# Patient Record
Sex: Male | Born: 1975 | Race: White | Hispanic: No | Smoking: Current some day smoker
Health system: Southern US, Community
[De-identification: ages and names within clinical notes are randomized; demographics above are authoritative.]

## PROBLEM LIST (undated history)

## (undated) DIAGNOSIS — T7840XA Allergy, unspecified, initial encounter: Secondary | ICD-10-CM

## (undated) DIAGNOSIS — K219 Gastro-esophageal reflux disease without esophagitis: Secondary | ICD-10-CM

## (undated) HISTORY — DX: Allergy, unspecified, initial encounter: T78.40XA

## (undated) HISTORY — DX: Gastro-esophageal reflux disease without esophagitis: K21.9

## (undated) HISTORY — PX: WISDOM TOOTH EXTRACTION: SHX21

## (undated) HISTORY — PX: BACK SURGERY: SHX140

---

## 2015-08-28 DIAGNOSIS — L570 Actinic keratosis: Secondary | ICD-10-CM | POA: Insufficient documentation

## 2015-08-28 DIAGNOSIS — D171 Benign lipomatous neoplasm of skin and subcutaneous tissue of trunk: Secondary | ICD-10-CM | POA: Insufficient documentation

## 2019-08-19 ENCOUNTER — Encounter: Payer: Self-pay | Admitting: Physician Assistant

## 2019-08-19 ENCOUNTER — Other Ambulatory Visit: Payer: Self-pay

## 2019-08-19 ENCOUNTER — Ambulatory Visit (INDEPENDENT_AMBULATORY_CARE_PROVIDER_SITE_OTHER): Payer: Federal, State, Local not specified - PPO | Admitting: Physician Assistant

## 2019-08-19 VITALS — BP 112/76 | HR 59 | Temp 98.9°F | Resp 16 | Ht 69.0 in | Wt 158.0 lb

## 2019-08-19 DIAGNOSIS — Z8371 Family history of colonic polyps: Secondary | ICD-10-CM

## 2019-08-19 DIAGNOSIS — F411 Generalized anxiety disorder: Secondary | ICD-10-CM | POA: Diagnosis not present

## 2019-08-19 DIAGNOSIS — Z23 Encounter for immunization: Secondary | ICD-10-CM | POA: Diagnosis not present

## 2019-08-19 DIAGNOSIS — Z0000 Encounter for general adult medical examination without abnormal findings: Secondary | ICD-10-CM

## 2019-08-19 DIAGNOSIS — Z0001 Encounter for general adult medical examination with abnormal findings: Secondary | ICD-10-CM

## 2019-08-19 DIAGNOSIS — K649 Unspecified hemorrhoids: Secondary | ICD-10-CM | POA: Diagnosis not present

## 2019-08-19 DIAGNOSIS — L821 Other seborrheic keratosis: Secondary | ICD-10-CM | POA: Diagnosis not present

## 2019-08-19 DIAGNOSIS — L814 Other melanin hyperpigmentation: Secondary | ICD-10-CM

## 2019-08-19 LAB — COMPREHENSIVE METABOLIC PANEL
ALT: 15 U/L (ref 0–53)
AST: 15 U/L (ref 0–37)
Albumin: 4.7 g/dL (ref 3.5–5.2)
Alkaline Phosphatase: 35 U/L — ABNORMAL LOW (ref 39–117)
BUN: 20 mg/dL (ref 6–23)
CO2: 30 mEq/L (ref 19–32)
Calcium: 9.6 mg/dL (ref 8.4–10.5)
Chloride: 103 mEq/L (ref 96–112)
Creatinine, Ser: 0.98 mg/dL (ref 0.40–1.50)
GFR: 82.97 mL/min (ref >60.00)
Glucose, Bld: 93 mg/dL (ref 70–99)
Potassium: 4.2 mEq/L (ref 3.5–5.1)
Sodium: 139 mEq/L (ref 135–145)
Total Bilirubin: 0.6 mg/dL (ref 0.2–1.2)
Total Protein: 6.6 g/dL (ref 6.0–8.3)

## 2019-08-19 LAB — CBC WITH DIFFERENTIAL/PLATELET
Basophils Absolute: 0.1 10*3/uL (ref 0.0–0.1)
Basophils Relative: 1.2 % (ref 0.0–3.0)
Eosinophils Absolute: 0.1 10*3/uL (ref 0.0–0.7)
Eosinophils Relative: 2.1 % (ref 0.0–5.0)
HCT: 41.1 % (ref 39.0–52.0)
Hemoglobin: 14.3 g/dL (ref 13.0–17.0)
Lymphocytes Relative: 30.3 % (ref 12.0–46.0)
Lymphs Abs: 1.3 10*3/uL (ref 0.7–4.0)
MCHC: 34.9 g/dL (ref 30.0–36.0)
MCV: 91.4 fl (ref 78.0–100.0)
Monocytes Absolute: 0.4 10*3/uL (ref 0.1–1.0)
Monocytes Relative: 10.5 % (ref 3.0–12.0)
Neutro Abs: 2.4 10*3/uL (ref 1.4–7.7)
Neutrophils Relative %: 55.9 % (ref 43.0–77.0)
Platelets: 130 10*3/uL — ABNORMAL LOW (ref 150.0–400.0)
RBC: 4.5 Mil/uL (ref 4.22–5.81)
RDW: 12.7 % (ref 11.5–15.5)
WBC: 4.2 10*3/uL (ref 4.0–10.5)

## 2019-08-19 LAB — LIPID PANEL
Cholesterol: 159 mg/dL (ref 0–200)
HDL: 47 mg/dL (ref >39.00)
LDL Cholesterol: 101 mg/dL — ABNORMAL HIGH (ref 0–99)
NonHDL: 111.77
Total CHOL/HDL Ratio: 3
Triglycerides: 52 mg/dL (ref 0.0–149.0)
VLDL: 10.4 mg/dL (ref 0.0–40.0)

## 2019-08-19 LAB — TSH: TSH: 1.37 u[IU]/mL (ref 0.35–4.50)

## 2019-08-19 LAB — HEMOGLOBIN A1C: Hgb A1c MFr Bld: 5.1 % (ref 4.6–6.5)

## 2019-08-19 NOTE — Patient Instructions (Signed)
Please go to the lab for blood work.   Our office will call you with your results unless you have chosen to receive results via MyChart.  If your blood work is normal we will follow-up each year for physicals and as scheduled for chronic medical problems.  If anything is abnormal we will treat accordingly and get you in for a follow-up.  Please consider letting us start a medication for anxiety while you are waiting to see the specialist.  -- Look into the following Paroxetine (Paxil), Fluoxetine (Prozac) or Escitalopram (Lexapro), Hydroxyzine (Atarax), Lorazepam (Ativan), BuSpirone (BuSpar)  You will be contacted for assessment for a screening colonoscopy giving history.    Preventive Care 33-73 Years Old, Male Preventive care refers to lifestyle choices and visits with your health care provider that can promote health and wellness. This includes:  A yearly physical exam. This is also called an annual well check.  Regular dental and eye exams.  Immunizations.  Screening for certain conditions.  Healthy lifestyle choices, such as eating a healthy diet, getting regular exercise, not using drugs or products that contain nicotine and tobacco, and limiting alcohol use. What can I expect for my preventive care visit? Physical exam Your health care provider will check:  Height and weight. These may be used to calculate body mass index (BMI), which is a measurement that tells if you are at a healthy weight.  Heart rate and blood pressure.  Your skin for abnormal spots. Counseling Your health care provider may ask you questions about:  Alcohol, tobacco, and drug use.  Emotional well-being.  Home and relationship well-being.  Sexual activity.  Eating habits.  Work and work Statistician. What immunizations do I need?  Influenza (flu) vaccine  This is recommended every year. Tetanus, diphtheria, and pertussis (Tdap) vaccine  You may need a Td booster every 10 years.  Varicella (chickenpox) vaccine  You may need this vaccine if you have not already been vaccinated. Zoster (shingles) vaccine  You may need this after age 86. Measles, mumps, and rubella (MMR) vaccine  You may need at least one dose of MMR if you were born in 1957 or later. You may also need a second dose. Pneumococcal conjugate (PCV13) vaccine  You may need this if you have certain conditions and were not previously vaccinated. Pneumococcal polysaccharide (PPSV23) vaccine  You may need one or two doses if you smoke cigarettes or if you have certain conditions. Meningococcal conjugate (MenACWY) vaccine  You may need this if you have certain conditions. Hepatitis A vaccine  You may need this if you have certain conditions or if you travel or work in places where you may be exposed to hepatitis A. Hepatitis B vaccine  You may need this if you have certain conditions or if you travel or work in places where you may be exposed to hepatitis B. Haemophilus influenzae type b (Hib) vaccine  You may need this if you have certain risk factors. Human papillomavirus (HPV) vaccine  If recommended by your health care provider, you may need three doses over 6 months. You may receive vaccines as individual doses or as more than one vaccine together in one shot (combination vaccines). Talk with your health care provider about the risks and benefits of combination vaccines. What tests do I need? Blood tests  Lipid and cholesterol levels. These may be checked every 5 years, or more frequently if you are over 71 years old.  Hepatitis C test.  Hepatitis B test. Screening  Lung cancer screening. You may have this screening every year starting at age 32 if you have a 30-pack-year history of smoking and currently smoke or have quit within the past 15 years.  Prostate cancer screening. Recommendations will vary depending on your family history and other risks.  Colorectal cancer screening. All  adults should have this screening starting at age 74 and continuing until age 55. Your health care provider may recommend screening at age 61 if you are at increased risk. You will have tests every 1-10 years, depending on your results and the type of screening test.  Diabetes screening. This is done by checking your blood sugar (glucose) after you have not eaten for a while (fasting). You may have this done every 1-3 years.  Sexually transmitted disease (STD) testing. Follow these instructions at home: Eating and drinking  Eat a diet that includes fresh fruits and vegetables, whole grains, lean protein, and low-fat dairy products.  Take vitamin and mineral supplements as recommended by your health care provider.  Do not drink alcohol if your health care provider tells you not to drink.  If you drink alcohol: ? Limit how much you have to 0-2 drinks a day. ? Be aware of how much alcohol is in your drink. In the [place] one drink equals one 12 oz bottle of beer (355 mL), one 5 oz glass of wine (148 mL), or one 1 oz glass of hard liquor (44 mL). Lifestyle  Take daily care of your teeth and gums.  Stay active. Exercise for at least 30 minutes on 5 or more days each week.  Do not use any products that contain nicotine or tobacco, such as cigarettes, e-cigarettes, and chewing tobacco. If you need help quitting, ask your health care provider.  If you are sexually active, practice safe sex. Use a condom or other form of protection to prevent STIs (sexually transmitted infections).  Talk with your health care provider about taking a low-dose aspirin every day starting at age 46. What's next?  Go to your health care provider once a year for a well check visit.  Ask your health care provider how often you should have your eyes and teeth checked.  Stay up to date on all vaccines. This information is not intended to replace advice given to you by your health care provider. Make sure you discuss  any questions you have with your health care provider. Document Released: 12/08/2015 Document Revised: 11/05/2018 Document Reviewed: 11/05/2018 Elsevier Patient Education  2020 [employer]. .

## 2019-08-19 NOTE — Progress Notes (Signed)
Patient presents to clinic today to establish care. Would like a physical today. Is fasting for labs.   Diet -- Endorses well-balanced diet overall. Eats a lot of vegetables and lean protein. Some fruits. Avoids fatty or fried foods. Limits carbohydrates.   Exercise -- Is doing calisthenics daily at home. For cardio is mainly running after the kids, bicycling and doing yard work.   Acute Concerns: Patient endorses significant family history of colon polyps -- mother, grandparents, father and siblings. Has never had screening. Denies bowel changes, melena, hematochezia or tenesmus. Has history of hemorrhoids and was recently seen and told he had 3. Painful with prolonged sitting. No current bleeding. Is hydrating well and keeping good fiber intake.   Patient with history of solar keratoses. Has a few areas on his shoulder that he would like assessed. Stays in the sun a lot. Sometimes wears sunscreen but not always.   Patient requesting referral to Psychiatry for some ongoing anxiety and difficulty with stressors. Denies depressed mood or anhedonia. Denies SI/HI. Has been to counseling before and does not feel it is beneficial.  Health Maintenance: Immunizations -- Unsure of Tetanus. Will obtain records. Agrees to flu shot today.  Past Medical History:  Diagnosis Date  . Allergy   . GERD (gastroesophageal reflux disease)     Past Surgical History:  Procedure Laterality Date  . BACK SURGERY      No current outpatient medications on file prior to visit.   No current facility-administered medications on file prior to visit.     No Known Allergies  Family History  Problem Relation Age of Onset  . Diabetes Mother   . Colonic polyp Mother   . Asthma Mother   . Heart murmur Mother   . Arrhythmia Mother   . Heart attack Father   . Colonic polyp Father   . Arthritis Father   . Hypertension Father   . Hyperlipidemia Father   . Diabetes Maternal Grandmother   . Heart attack  Maternal Grandfather   . Cancer Paternal Grandmother   . Early death Paternal Grandfather     Social History   Socioeconomic History  . Marital status: Married    Spouse name: Not on file  . Number of children: Not on file  . Years of education: Not on file  . Highest education level: Not on file  Occupational History  . Not on file  Social Needs  . Financial resource strain: Not on file  . Food insecurity    Worry: Not on file    Inability: Not on file  . Transportation needs    Medical: Not on file    Non-medical: Not on file  Tobacco Use  . Smoking status: Never Smoker  . Smokeless tobacco: Never Used  Substance and Sexual Activity  . Alcohol use: Yes  . Drug use: Not Currently  . Sexual activity: Yes  Lifestyle  . Physical activity    Days per week: Not on file    Minutes per session: Not on file  . Stress: Not on file  Relationships  . Social Herbalist on phone: Not on file    Gets together: Not on file    Attends religious service: Not on file    Active member of club or organization: Not on file    Attends meetings of clubs or organizations: Not on file    Relationship status: Not on file  . Intimate partner violence    Fear of  current or ex partner: Not on file    Emotionally abused: Not on file    Physically abused: Not on file    Forced sexual activity: Not on file  Other Topics Concern  . Not on file  Social History Narrative  . Not on file   Review of Systems  Constitutional: Negative for fever and weight loss.  HENT: Negative for ear discharge, ear pain, hearing loss and tinnitus.   Eyes: Negative for blurred vision, double vision, photophobia and pain.  Respiratory: Negative for cough and shortness of breath.   Cardiovascular: Negative for chest pain and palpitations.  Gastrointestinal: Negative for abdominal pain, blood in stool, constipation, diarrhea, heartburn, melena, nausea and vomiting.  Genitourinary: Negative for dysuria,  flank pain, frequency, hematuria and urgency.  Musculoskeletal: Negative for falls.  Neurological: Negative for dizziness, loss of consciousness and headaches.  Endo/Heme/Allergies: Negative for environmental allergies.  Psychiatric/Behavioral: Negative for depression, hallucinations, substance abuse and suicidal ideas. The patient is not nervous/anxious and does not have insomnia.    Ht 5\' 9"  (1.753 m)   Wt 158 lb (71.7 kg)   BMI 23.33 kg/m   Physical Exam Vitals signs reviewed.  Constitutional:      General: He is not in acute distress.    Appearance: He is well-developed. He is not diaphoretic.  HENT:     Head: Normocephalic and atraumatic.     Right Ear: Tympanic membrane, ear canal and external ear normal.     Left Ear: Tympanic membrane, ear canal and external ear normal.     Nose: Nose normal.     Mouth/Throat:     Pharynx: No posterior oropharyngeal erythema.  Eyes:     Conjunctiva/sclera: Conjunctivae normal.     Pupils: Pupils are equal, round, and reactive to light.  Neck:     Musculoskeletal: Neck supple.     Thyroid: No thyromegaly.  Cardiovascular:     Rate and Rhythm: Normal rate and regular rhythm.     Heart sounds: Normal heart sounds.  Pulmonary:     Effort: Pulmonary effort is normal. No respiratory distress.     Breath sounds: Normal breath sounds. No wheezing or rales.  Chest:     Chest wall: No tenderness.  Abdominal:     General: Bowel sounds are normal. There is no distension.     Palpations: Abdomen is soft. There is no mass.     Tenderness: There is no abdominal tenderness. There is no guarding or rebound.  Lymphadenopathy:     Cervical: No cervical adenopathy.  Skin:    General: Skin is warm and dry.     Findings: No rash.  Neurological:     Mental Status: He is alert and oriented to person, place, and time.     Cranial Nerves: No cranial nerve deficit.    Assessment/Plan: 1. Visit for preventive health examination Depression screen  negative. Health Maintenance reviewed. Preventive schedule discussed and handout given in AVS. Will obtain fasting labs today.  - CBC with Differential/Platelet - Comprehensive metabolic panel - Hemoglobin A1c - Lipid panel - TSH  2. Family history of colonic polyps 3. Hemorrhoids, unspecified hemorrhoid type Referral to GI placed for screening colonoscopy.  - Ambulatory referral to Gastroenterology  4. Seborrheic keratosis Noted of left anterior scalp. Benign etiology reviewed with patient.   5. Solar lentigo Shoulder bilaterally. Skin care reviewed with patient. Skin protection reviewed. Recommend annual comprehensive skin examinations.  6. Generalized anxiety disorder Request referral to Psychiatry. Referral placed.  Discussed counseling but patient declined.  - Ambulatory referral to Psychiatry  7. Need for immunization against influenza - Flu Vaccine QUAD 36+ mos IM   Leeanne Rio, PA-C

## 2019-08-26 ENCOUNTER — Encounter: Payer: Self-pay | Admitting: Physician Assistant

## 2019-09-01 ENCOUNTER — Encounter: Payer: Self-pay | Admitting: Physician Assistant

## 2019-09-01 DIAGNOSIS — F329 Major depressive disorder, single episode, unspecified: Secondary | ICD-10-CM

## 2019-09-01 DIAGNOSIS — F419 Anxiety disorder, unspecified: Secondary | ICD-10-CM

## 2019-09-03 ENCOUNTER — Encounter: Payer: Self-pay | Admitting: Physician Assistant

## 2019-09-03 ENCOUNTER — Ambulatory Visit: Payer: Federal, State, Local not specified - PPO | Admitting: Physician Assistant

## 2019-09-03 ENCOUNTER — Other Ambulatory Visit: Payer: Self-pay

## 2019-09-03 VITALS — BP 118/66 | HR 70 | Temp 98.7°F | Ht 69.0 in | Wt 159.0 lb

## 2019-09-03 DIAGNOSIS — K649 Unspecified hemorrhoids: Secondary | ICD-10-CM | POA: Diagnosis not present

## 2019-09-03 DIAGNOSIS — Z8371 Family history of colonic polyps: Secondary | ICD-10-CM

## 2019-09-03 NOTE — Progress Notes (Signed)
Chief Complaint: Family history of colon polyps, hemorrhoids  HPI:    Scott Gregory is a 43 year old Caucasian male with a past medical history as listed below, who was referred to me by Scott Jeans, Scott Gregory for a complaint of family history of colon polyps and hemorrhoids.      08/19/2019 patient saw PCP and described a significant family history of colon polyps in his mother, grandparents, father and siblings.  At that time described a history of hemorrhoids and had recently been seen and told he had 3 which were painful with prolonged sitting.    08/19/2019 CMP, CBC and TSH normal.    Today, the patient presents to clinic and explains that he has a family history of polyps in his maternal grandmother, mom, sister and dad.  Tells me he is not sure exactly when they started having polyps removed, he knows his sister was at the age of 32.  He is also unsure of how many polyps but does not think it was more than 10.  Tells me he will have to get the details.  Denies family history of colon cancer.    Also tells me today that he has a history of hemorrhoids over the past 3 to 5 years which seem to come and go.  When they come on there is a lot of discomfort and he "feels something back there", but he has never had any rectal bleeding.  They typically go away within 3 to 5 days.  He only gets them once every couple of years, the last time was a few months ago and they were "multiples".  It was so bad that he "did not want to get out of bed".  It is now better.  Also describes that very occasionally he will have some mucus with a bowel movement or without but he has not seen any in the past couple of years.    Social history is positive for being the father of 3 boys, tells me they have moved every couple of years and this is stressful and adds to his anxiety.    Denies fever, chills, weight loss, change in bowel habits, abdominal pain, heartburn or reflux.  Past Medical History:  Diagnosis Date  .  Allergy   . GERD (gastroesophageal reflux disease)    Dietary and TLC related -- in 20s. No issues since stopping tobacco use    Past Surgical History:  Procedure Laterality Date  . BACK SURGERY      No current outpatient medications on file.   No current facility-administered medications for this visit.     Allergies as of 09/03/2019  . (No Known Allergies)    Family History  Problem Relation Age of Onset  . Diabetes Mother   . Colonic polyp Mother   . Asthma Mother   . Heart murmur Mother   . Arrhythmia Mother   . Heart attack Father   . Colonic polyp Father   . Arthritis Father   . Hypertension Father   . Hyperlipidemia Father   . Hearing loss Father   . Diabetes Maternal Grandmother   . Heart attack Maternal Grandfather   . Cancer Paternal Grandmother   . Early death Paternal Grandfather   . Colon polyps Sister     Social History   Socioeconomic History  . Marital status: Married    Spouse name: Not on file  . Number of children: Not on file  . Years of education: Not on file  .  Highest education level: Not on file  Occupational History  . Not on file  Social Needs  . Financial resource strain: Not on file  . Food insecurity    Worry: Not on file    Inability: Not on file  . Transportation needs    Medical: Not on file    Non-medical: Not on file  Tobacco Use  . Smoking status: Never Smoker  . Smokeless tobacco: Never Used  Substance and Sexual Activity  . Alcohol use: Yes  . Drug use: Not Currently  . Sexual activity: Yes  Lifestyle  . Physical activity    Days per week: Not on file    Minutes per session: Not on file  . Stress: Not on file  Relationships  . Social Herbalist on phone: Not on file    Gets together: Not on file    Attends religious service: Not on file    Active member of club or organization: Not on file    Attends meetings of clubs or organizations: Not on file    Relationship status: Not on file  . Intimate  partner violence    Fear of current or ex partner: Not on file    Emotionally abused: Not on file    Physically abused: Not on file    Forced sexual activity: Not on file  Other Topics Concern  . Not on file  Social History Narrative  . Not on file    Review of Systems:    Constitutional: No weight loss, fever or chills Skin: No rash  Cardiovascular: No chest pain Respiratory: No SOB  Gastrointestinal: See HPI and otherwise negative Genitourinary: No dysuria  Neurological: No headache, dizziness or syncope Musculoskeletal: No new muscle or joint pain Hematologic: No bleeding  Psychiatric: +anxiety   Physical Exam:  Vital signs: BP 118/66   Pulse 70   Temp 98.7 F (37.1 C) (Oral)   Ht 5\' 9"  (1.753 m)   Wt 159 lb (72.1 kg)   BMI 23.48 kg/m   Constitutional:   Pleasant Caucasian male appears to be in NAD, Well developed, Well nourished, alert and cooperative Head:  Normocephalic and atraumatic. Eyes:   PEERL, EOMI. No icterus. Conjunctiva pink. Ears:  Normal auditory acuity. Neck:  Supple Throat: Oral cavity and pharynx without inflammation, swelling or lesion.  Respiratory: Respirations even and unlabored. Lungs clear to auscultation bilaterally.   No wheezes, crackles, or rhonchi.  Cardiovascular: Normal S1, S2. No MRG. Regular rate and rhythm. No peripheral edema, cyanosis or pallor.  Gastrointestinal:  Soft, nondistended, nontender. No rebound or guarding. Normal bowel sounds. No appreciable masses or hepatomegaly. Rectal:  Not performed.  Msk:  Symmetrical without gross deformities. Without edema, no deformity or joint abnormality.  Neurologic:  Alert and  oriented x4;  grossly normal neurologically.  Skin:   Dry and intact without significant lesions or rashes. Psychiatric: Demonstrates good judgement and reason without abnormal affect or behaviors.  RELEVANT LABS AND IMAGING: CBC    Component Value Date/Time   WBC 4.2 08/19/2019 1141   RBC 4.50 08/19/2019 1141    HGB 14.3 08/19/2019 1141   HCT 41.1 08/19/2019 1141   PLT 130.0 (L) 08/19/2019 1141   MCV 91.4 08/19/2019 1141   MCHC 34.9 08/19/2019 1141   RDW 12.7 08/19/2019 1141   LYMPHSABS 1.3 08/19/2019 1141   MONOABS 0.4 08/19/2019 1141   EOSABS 0.1 08/19/2019 1141   BASOSABS 0.1 08/19/2019 1141    CMP  Component Value Date/Time   NA 139 08/19/2019 1141   K 4.2 08/19/2019 1141   CL 103 08/19/2019 1141   CO2 30 08/19/2019 1141   GLUCOSE 93 08/19/2019 1141   BUN 20 08/19/2019 1141   CREATININE 0.98 08/19/2019 1141   CALCIUM 9.6 08/19/2019 1141   PROT 6.6 08/19/2019 1141   ALBUMIN 4.7 08/19/2019 1141   AST 15 08/19/2019 1141   ALT 15 08/19/2019 1141   ALKPHOS 35 (L) 08/19/2019 1141   BILITOT 0.6 08/19/2019 1141    Assessment: 1.  Family history of colon polyps: Maternal grandmother, mother, father and sister, unsure of age when diagnosed or what type of polyps or how many 2.  Hemorrhoids: Off-and-on over the past 3 to 5 years, sound like external hemorrhoids with pain  Plan: 1.  Discussed with the patient that he would need to find out more details about his family history of colon polyps.  To be covered for a screening colonoscopy.  He needs to find out how many polyps and at what age they were diagnosed and what type of polyp they were if possible.  He tells me he will find out this information. 2.  Discussed hemorrhoids briefly, at this moment there are none per the patient, explained that if they are internal hemorrhoids we do a banding procedure.  Briefly discussed.  If they become more chronic for him we can discuss further.  Would recommend he have a colonoscopy before banding procedure if he needs one in the future. 3.  Patient is going to call us back with further details about polyps.  He is not sure if he wants to go through with a colonoscopy if it is for diagnostic purposes, due to cost.  He will also call his insurance and see how expensive this would be for him.  If he  would like a colonoscopy in the future within the next year this can be a direct colonoscopy with a nurse previsit. 4.  Patient was assigned to Dr. Rush Landmark today.  Ellouise Newer, Scott Gregory [facility] 09/03/2019, 8:25 AM  Cc: Scott Jeans, Scott Gregory

## 2019-09-03 NOTE — Patient Instructions (Signed)
We will put in a recall for a colonoscopy in 6 years.  Please follow up as needed

## 2019-09-04 NOTE — Progress Notes (Signed)
Attending Physician's Attestation   I have reviewed the chart.   I agree with the Advanced Practitioner's note, impression, and recommendations with any updates as below.  Agree a colonoscopy is recommended from a diagnostic perspective.  From a family history of colon polyps the history of multiple first-degree relatives and a second-degree relatives should be enough in regards to considering an early colonoscopy but he does have to check with his insurance.  If he elects not to have it done, some insurances allow [place] to initiate colon cancer screening at the [place] age of 47 and that he could have that done at the time of his next birthday.  However, we do not want rectal bleeding to just be thought of as hemorrhoidal and have a missing lesion.  Hopefully he will decide on a colonoscopy if able to financially.  Justice Britain, MD [facility] Advanced Endoscopy Office # [id number]

## 2019-09-06 MED ORDER — LORAZEPAM 0.5 MG PO TABS: 0.5000 mg | ORAL_TABLET | Freq: Two times a day (BID) | ORAL | 1 refills | Status: DC | PRN

## 2019-09-06 MED ORDER — SERTRALINE HCL 25 MG PO TABS: 25.0000 mg | ORAL_TABLET | Freq: Every day | ORAL | 1 refills | Status: DC

## 2019-09-06 NOTE — Telephone Encounter (Signed)
Please look at Psychiatry referral. Can we see if there is a location where MD can see him? He does not want to see NP. Remember this is for Psychiatry, not Psychology.

## 2019-09-06 NOTE — Addendum Note (Signed)
Addended by: Brunetta Jeans on: 09/06/2019 11:44 AM   Modules accepted: Orders

## 2019-09-28 ENCOUNTER — Ambulatory Visit (HOSPITAL_COMMUNITY): Payer: Federal, State, Local not specified - PPO | Admitting: Psychiatry

## 2019-10-15 ENCOUNTER — Other Ambulatory Visit: Payer: Self-pay

## 2019-10-15 ENCOUNTER — Encounter (HOSPITAL_COMMUNITY): Payer: Self-pay | Admitting: Psychiatry

## 2019-10-15 ENCOUNTER — Ambulatory Visit (INDEPENDENT_AMBULATORY_CARE_PROVIDER_SITE_OTHER): Payer: Federal, State, Local not specified - PPO | Admitting: Psychiatry

## 2019-10-15 DIAGNOSIS — F419 Anxiety disorder, unspecified: Secondary | ICD-10-CM
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
## 2020-01-11 DIAGNOSIS — K080 Exfoliation of teeth due to systemic causes: Secondary | ICD-10-CM | POA: Diagnosis not present

## 2020-06-09 ENCOUNTER — Encounter: Payer: Self-pay | Admitting: Physician Assistant

## 2020-06-09 DIAGNOSIS — F419 Anxiety disorder, unspecified: Secondary | ICD-10-CM

## 2020-06-09 MED ORDER — LORAZEPAM 0.5 MG PO TABS: 0.5000 mg | ORAL_TABLET | Freq: Two times a day (BID) | ORAL | 1 refills | Status: DC | PRN

## 2020-09-11 ENCOUNTER — Other Ambulatory Visit: Payer: Self-pay

## 2020-09-11 ENCOUNTER — Encounter: Payer: Self-pay | Admitting: Physician Assistant

## 2020-09-11 ENCOUNTER — Ambulatory Visit (INDEPENDENT_AMBULATORY_CARE_PROVIDER_SITE_OTHER): Payer: Federal, State, Local not specified - PPO | Admitting: Physician Assistant

## 2020-09-11 VITALS — BP 132/80 | HR 58 | Temp 98.4°F | Resp 16 | Ht 69.0 in | Wt 169.0 lb

## 2020-09-11 DIAGNOSIS — R079 Chest pain, unspecified: Secondary | ICD-10-CM | POA: Diagnosis not present

## 2020-09-11 DIAGNOSIS — Z1211 Encounter for screening for malignant neoplasm of colon: Secondary | ICD-10-CM | POA: Diagnosis not present

## 2020-09-11 DIAGNOSIS — G8929 Other chronic pain: Secondary | ICD-10-CM

## 2020-09-11 DIAGNOSIS — Z0001 Encounter for general adult medical examination with abnormal findings: Secondary | ICD-10-CM

## 2020-09-11 DIAGNOSIS — Z0000 Encounter for general adult medical examination without abnormal findings: Secondary | ICD-10-CM | POA: Diagnosis not present

## 2020-09-11 DIAGNOSIS — L819 Disorder of pigmentation, unspecified: Secondary | ICD-10-CM | POA: Diagnosis not present

## 2020-09-11 DIAGNOSIS — Z1159 Encounter for screening for other viral diseases: Secondary | ICD-10-CM | POA: Diagnosis not present

## 2020-09-11 LAB — LIPID PANEL
Cholesterol: 182 mg/dL (ref 0–200)
HDL: 57.6 mg/dL (ref >39.00)
LDL Cholesterol: 114 mg/dL — ABNORMAL HIGH (ref 0–99)
NonHDL: 124.27
Total CHOL/HDL Ratio: 3
Triglycerides: 52 mg/dL (ref 0.0–149.0)
VLDL: 10.4 mg/dL (ref 0.0–40.0)

## 2020-09-11 LAB — COMPREHENSIVE METABOLIC PANEL
ALT: 19 U/L (ref 0–53)
AST: 17 U/L (ref 0–37)
Albumin: 4.5 g/dL (ref 3.5–5.2)
Alkaline Phosphatase: 41 U/L (ref 39–117)
BUN: 17 mg/dL (ref 6–23)
CO2: 31 mEq/L (ref 19–32)
Calcium: 9.4 mg/dL (ref 8.4–10.5)
Chloride: 101 mEq/L (ref 96–112)
Creatinine, Ser: 0.92 mg/dL (ref 0.40–1.50)
GFR: 99.84 mL/min (ref >60.00)
Glucose, Bld: 84 mg/dL (ref 70–99)
Potassium: 4.3 mEq/L (ref 3.5–5.1)
Sodium: 138 mEq/L (ref 135–145)
Total Bilirubin: 0.7 mg/dL (ref 0.2–1.2)
Total Protein: 6.7 g/dL (ref 6.0–8.3)

## 2020-09-11 LAB — HEMOGLOBIN A1C: Hgb A1c MFr Bld: 5.3 % (ref 4.6–6.5)

## 2020-09-11 LAB — PSA: PSA: 0.3 ng/mL (ref 0.10–4.00)

## 2020-09-11 NOTE — Patient Instructions (Addendum)
Please go to the lab for blood work.   Our office will call you with your results unless you have chosen to receive results via MyChart.  If your blood work is normal we will follow-up each year for physicals and as scheduled for chronic medical problems.  If anything is abnormal we will treat accordingly and get you in for a follow-up.  You will be contacted by Cardiology for further evaluation giving your history and current symptoms. I do feel that this is musculoskeletal in nature but warrants further assessment.   Avoid heavy lifting and overexertion until we get things checked out. I am sending you for a chest x-ray as well. This will be done anytime M-F 8-5 at our [place] office -- [address] [place], [place]. No appointment needed.   You will also be contacted to discuss screening colonoscopy and Dermatology for a comprehensive skin examination.    Preventive Care 26-70 Years Old, Male Preventive care refers to lifestyle choices and visits with your health care provider that can promote health and wellness. This includes:  A yearly physical exam. This is also called an annual well check.  Regular dental and eye exams.  Immunizations.  Screening for certain conditions.  Healthy lifestyle choices, such as eating a healthy diet, getting regular exercise, not using drugs or products that contain nicotine and tobacco, and limiting alcohol use. What can I expect for my preventive care visit? Physical exam Your health care provider will check:  Height and weight. These may be used to calculate body mass index (BMI), which is a measurement that tells if you are at a healthy weight.  Heart rate and blood pressure.  Your skin for abnormal spots. Counseling Your health care provider may ask you questions about:  Alcohol, tobacco, and drug use.  Emotional well-being.  Home and relationship well-being.  Sexual activity.  Eating habits.  Work and work  Statistician. What immunizations do I need?  Influenza (flu) vaccine  This is recommended every year. Tetanus, diphtheria, and pertussis (Tdap) vaccine  You may need a Td booster every 10 years. Varicella (chickenpox) vaccine  You may need this vaccine if you have not already been vaccinated. Zoster (shingles) vaccine  You may need this after age 51. Measles, mumps, and rubella (MMR) vaccine  You may need at least one dose of MMR if you were born in 1957 or later. You may also need a second dose. Pneumococcal conjugate (PCV13) vaccine  You may need this if you have certain conditions and were not previously vaccinated. Pneumococcal polysaccharide (PPSV23) vaccine  You may need one or two doses if you smoke cigarettes or if you have certain conditions. Meningococcal conjugate (MenACWY) vaccine  You may need this if you have certain conditions. Hepatitis A vaccine  You may need this if you have certain conditions or if you travel or work in places where you may be exposed to hepatitis A. Hepatitis B vaccine  You may need this if you have certain conditions or if you travel or work in places where you may be exposed to hepatitis B. Haemophilus influenzae type b (Hib) vaccine  You may need this if you have certain risk factors. Human papillomavirus (HPV) vaccine  If recommended by your health care provider, you may need three doses over 6 months. You may receive vaccines as individual doses or as more than one vaccine together in one shot (combination vaccines). Talk with your health care provider about the risks and benefits of  combination vaccines. What tests do I need? Blood tests  Lipid and cholesterol levels. These may be checked every 5 years, or more frequently if you are over 49 years old.  Hepatitis C test.  Hepatitis B test. Screening  Lung cancer screening. You may have this screening every year starting at age 36 if you have a 30-pack-year history of smoking  and currently smoke or have quit within the past 15 years.  Prostate cancer screening. Recommendations will vary depending on your family history and other risks.  Colorectal cancer screening. All adults should have this screening starting at age 9 and continuing until age 36. Your health care provider may recommend screening at age 30 if you are at increased risk. You will have tests every 1-10 years, depending on your results and the type of screening test.  Diabetes screening. This is done by checking your blood sugar (glucose) after you have not eaten for a while (fasting). You may have this done every 1-3 years.  Sexually transmitted disease (STD) testing. Follow these instructions at home: Eating and drinking  Eat a diet that includes fresh fruits and vegetables, whole grains, lean protein, and low-fat dairy products.  Take vitamin and mineral supplements as recommended by your health care provider.  Do not drink alcohol if your health care provider tells you not to drink.  If you drink alcohol: ? Limit how much you have to 0-2 drinks a day. ? Be aware of how much alcohol is in your drink. In the [place] one drink equals one 12 oz bottle of beer (355 mL), one 5 oz glass of wine (148 mL), or one 1 oz glass of hard liquor (44 mL). Lifestyle  Take daily care of your teeth and gums.  Stay active. Exercise for at least 30 minutes on 5 or more days each week.  Do not use any products that contain nicotine or tobacco, such as cigarettes, e-cigarettes, and chewing tobacco. If you need help quitting, ask your health care provider.  If you are sexually active, practice safe sex. Use a condom or other form of protection to prevent STIs (sexually transmitted infections).  Talk with your health care provider about taking a low-dose aspirin every day starting at age 59. What's next?  Go to your health care provider once a year for a well check visit.  Ask your health care provider how  often you should have your eyes and teeth checked.  Stay up to date on all vaccines. This information is not intended to replace advice given to you by your health care provider. Make sure you discuss any questions you have with your health care provider. Document Revised: 11/05/2018 Document Reviewed: 11/05/2018 Elsevier Patient Education  2020 [employer].

## 2020-09-11 NOTE — Progress Notes (Signed)
Patient presents to clinic today for annual exam.  Patient is fasting for labs.  Endorses 6 months of consistent but varying intensity of L-sided chest pain and sternal chest pain. Most noticeable with deep breathing. Is not affected by exercise. Denies radiation into arm but occasionally goes up into the shoulder. Denies epigastric pain/LUQ pain, nausea or vomiting.. Denies chest congestion, cough or SOB. Does have allergies but not causing an issue currently. Denies palpitations, lightheadedness or dizziness or syncope. Is very active, working out, walking/hiking several times per week. Does note history of either ASD or VSD, cannot remember. Was checked up on until he was 4, then again at 42, 61 and 9. No issues but has not been checked since.   Health Maintenance: Immunizations -- Tetanus up-to-date. Declines flu shot.  Colon Cancer Screening -- Due. Family history of colon polyps.  HIV/Hep C Screening -- Declines HIV screen. Will proceed with Hep C screen.   Past Medical History:  Diagnosis Date  . Allergy   . GERD (gastroesophageal reflux disease)    Dietary and TLC related -- in 20s. No issues since stopping tobacco use    Past Surgical History:  Procedure Laterality Date  . BACK SURGERY    . WISDOM TOOTH EXTRACTION      No current outpatient medications on file prior to visit.   No current facility-administered medications on file prior to visit.    No Known Allergies  Family History  Problem Relation Age of Onset  . Diabetes Mother   . Colonic polyp Mother   . Asthma Mother   . Heart murmur Mother   . Arrhythmia Mother   . Heart attack Father   . Colonic polyp Father   . Arthritis Father   . Hypertension Father   . Hyperlipidemia Father   . Hearing loss Father   . Diabetes Maternal Grandmother   . Heart attack Maternal Grandfather   . Cancer Paternal Grandmother   . Early death Paternal Grandfather   . Colon polyps Sister     Social History    Socioeconomic History  . Marital status: Married    Spouse name: Not on file  . Number of children: Not on file  . Years of education: Not on file  . Highest education level: Not on file  Occupational History  . Not on file  Tobacco Use  . Smoking status: Never Smoker  . Smokeless tobacco: Never Used  Vaping Use  . Vaping Use: Never used  Substance and Sexual Activity  . Alcohol use: Yes  . Drug use: Not Currently  . Sexual activity: Yes  Other Topics Concern  . Not on file  Social History Narrative  . Not on file   Social Determinants of Health   Financial Resource Strain:   . Difficulty of Paying Living Expenses: Not on file  Food Insecurity:   . Worried About Charity fundraiser in the Last Year: Not on file  . Ran Out of Food in the Last Year: Not on file  Transportation Needs:   . Lack of Transportation (Medical): Not on file  . Lack of Transportation (Non-Medical): Not on file  Physical Activity:   . Days of Exercise per Week: Not on file  . Minutes of Exercise per Session: Not on file  Stress:   . Feeling of Stress : Not on file  Social Connections:   . Frequency of Communication with Friends and Family: Not on file  . Frequency of Social  Gatherings with Friends and Family: Not on file  . Attends Religious Services: Not on file  . Active Member of Clubs or Organizations: Not on file  . Attends Archivist Meetings: Not on file  . Marital Status: Not on file  Intimate Partner Violence:   . Fear of Current or Ex-Partner: Not on file  . Emotionally Abused: Not on file  . Physically Abused: Not on file  . Sexually Abused: Not on file   Review of Systems  Constitutional: Negative for fever and weight loss.  HENT: Negative for ear discharge, ear pain, hearing loss and tinnitus.   Eyes: Negative for blurred vision, double vision, photophobia and pain.  Respiratory: Negative for cough and shortness of breath.   Cardiovascular: Negative for chest pain  and palpitations.  Gastrointestinal: Negative for abdominal pain, blood in stool, constipation, diarrhea, heartburn, melena, nausea and vomiting.  Genitourinary: Negative for dysuria, flank pain, frequency, hematuria and urgency.       Nocturia x 0-1  Musculoskeletal: Negative for falls.  Neurological: Negative for dizziness, loss of consciousness and headaches.  Endo/Heme/Allergies: Negative for environmental allergies.  Psychiatric/Behavioral: Negative for depression, hallucinations, substance abuse and suicidal ideas. The patient is not nervous/anxious and does not have insomnia.    BP 132/80   Pulse (!) 58   Temp 98.4 F (36.9 C) (Temporal)   Resp 16   Ht 5\' 9"  (1.753 m)   Wt 169 lb (76.7 kg)   SpO2 99%   BMI 24.96 kg/m   Physical Exam Vitals reviewed.  Constitutional:      General: He is not in acute distress.    Appearance: He is well-developed. He is not diaphoretic.  HENT:     Head: Normocephalic and atraumatic.     Right Ear: Tympanic membrane, ear canal and external ear normal.     Left Ear: Tympanic membrane, ear canal and external ear normal.     Nose: Nose normal.     Mouth/Throat:     Pharynx: No posterior oropharyngeal erythema.  Eyes:     Conjunctiva/sclera: Conjunctivae normal.     Pupils: Pupils are equal, round, and reactive to light.  Neck:     Thyroid: No thyromegaly.  Cardiovascular:     Rate and Rhythm: Normal rate and regular rhythm.     Heart sounds: Normal heart sounds.  Pulmonary:     Effort: Pulmonary effort is normal. No respiratory distress.     Breath sounds: Normal breath sounds. No wheezing or rales.  Chest:     Chest wall: No tenderness.  Abdominal:     General: Bowel sounds are normal. There is no distension.     Palpations: Abdomen is soft. There is no mass.     Tenderness: There is no abdominal tenderness. There is no guarding or rebound.  Musculoskeletal:     Cervical back: Neck supple.  Lymphadenopathy:     Cervical: No  cervical adenopathy.  Skin:    General: Skin is warm and dry.     Findings: No rash.  Neurological:     Mental Status: He is alert and oriented to person, place, and time.     Cranial Nerves: No cranial nerve deficit.    Assessment/Plan: 1. Chronic chest pain Ongoing. Seems most likely MSK in nature giving history given. Patient however with histroy of septal defect, unsure ASD or VSD. No records for review. Will proceed with cardiac assessment first to rule out any serious causes. EKG today with sinus bradycardia.  Will obtain x-ray. Will also check labs today to risk stratify. Referral to Cards placed for consideration of stress testing and echocardiogram. Strict ER precautions reviewed with patient.  - EKG 12-Lead - Comprehensive metabolic panel - Lipid panel - Hemoglobin A1c - DG Chest 2 View; Future - Ambulatory referral to Cardiology  2. Visit for preventive health examination Depression screen negative. Health Maintenance reviewed. Preventive schedule discussed and handout given in AVS. Will obtain fasting labs today.  - CBC with Differential/Platelet - Comprehensive metabolic panel - Lipid panel - Hemoglobin A1c - PSA  3. Colon cancer screening Due giving age.Also family history of colon polyps. Referral to GI placed for screening colonoscopy.  - Ambulatory referral to Gastroenterology  4. Need for hepatitis C screening test - Hepatitis C Antibody  5. Atypical pigmented skin lesion Referral to dermatology placed for annual comprehensive skin examination giving patient's light complexion and risk for skin cancers.  - Ambulatory referral to Dermatology    This visit occurred during the SARS-CoV-2 public health emergency.  Safety protocols were in place, including screening questions prior to the visit, additional usage of staff PPE, and extensive cleaning of exam room while observing appropriate contact time as indicated for disinfecting solutions.    Leeanne Rio, PA-C

## 2020-09-12 ENCOUNTER — Telehealth: Payer: Self-pay

## 2020-09-12 LAB — HEPATITIS C ANTIBODY
Hepatitis C Ab: NONREACTIVE
SIGNAL TO CUT-OFF: 0.01 (ref <1.00)

## 2020-09-12 NOTE — Telephone Encounter (Signed)
Left a vm message for the patient to call the office back. Patient needs a lab appointment at his convenience for redraw per [facility] due to platelets clumping together.

## 2020-09-22 ENCOUNTER — Other Ambulatory Visit: Payer: Self-pay

## 2020-09-22 ENCOUNTER — Ambulatory Visit (HOSPITAL_COMMUNITY)
Admission: RE | Admit: 2020-09-22 | Discharge: 2020-09-22 | Disposition: A | Discharge status: Home / Self Care | Payer: Federal, State, Local not specified - PPO | Source: Ambulatory Visit | Attending: Physician Assistant | Admitting: Physician Assistant

## 2020-09-22 DIAGNOSIS — G8929 Other chronic pain: Secondary | ICD-10-CM

## 2020-09-22 DIAGNOSIS — R079 Chest pain, unspecified: Secondary | ICD-10-CM | POA: Insufficient documentation

## 2020-10-04 ENCOUNTER — Other Ambulatory Visit: Payer: Self-pay

## 2020-10-04 ENCOUNTER — Encounter: Payer: Self-pay | Admitting: Internal Medicine

## 2020-10-04 ENCOUNTER — Ambulatory Visit: Payer: Federal, State, Local not specified - PPO | Admitting: Internal Medicine

## 2020-10-04 VITALS — BP 136/84 | HR 51 | Ht 69.0 in | Wt 176.2 lb

## 2020-10-04 DIAGNOSIS — R079 Chest pain, unspecified: Secondary | ICD-10-CM

## 2020-10-04 DIAGNOSIS — Z8249 Family history of ischemic heart disease and other diseases of the circulatory system: Secondary | ICD-10-CM | POA: Diagnosis not present

## 2020-10-04 DIAGNOSIS — Z8774 Personal history of (corrected) congenital malformations of heart and circulatory system: Secondary | ICD-10-CM

## 2020-10-04 NOTE — Progress Notes (Signed)
OFFICE CONSULT NOTE  Chief Complaint:  Chest pain  Primary Care Physician: Brunetta Jeans, PA-C  HPI:  Keyshon Stein is a 44 y.o. male who is being seen today for the evaluation of chest pain at the request of Delorse Limber.  This is a pleasant 44 year old male kindly referred for evaluation of chest pain.  He more recently moved to [place] but grew up in [place].  He reports a history of congenital heart disease with either history of ASD or VSD, he cannot actually remember.  He also has a number of other family histories including mother who had A. fib, father who had pericarditis and subsequently 5 different heart attacks.  The grandmother with atrial and ventricular septal defects, a grandfather with heart attack in his 6s and an uncle who had a massive heart attack in his 67s.  He is concerned about cardiovascular risk but also has been experiencing chest tightness.  It does not seem to be associated with exercise or exertion and he is physically active and does go to the gym.  He also notes that he can have it at rest.  He seems to have more of it when he is at work and is stressed.  He feels tightness in the carriers across his chest.  Is somewhat constant and again not relieved by rest or provoked by exertion.  EKG shows sinus bradycardia without ischemia.  He takes no medications and has no other comorbid conditions that are noted such as diabetes, hypertension or significant dyslipidemia.  Cholesterol testing in October showed total cholesterol 182, HDL 57, LDL 114 triglyceride 52.  A1c was 5.3 and a creatinine was normal at 0.9.  PMHx:  Past Medical History:  Diagnosis Date  . Allergy   . GERD (gastroesophageal reflux disease)    Dietary and TLC related -- in 20s. No issues since stopping tobacco use    Past Surgical History:  Procedure Laterality Date  . BACK SURGERY    . WISDOM TOOTH EXTRACTION      FAMHx:  Family History  Problem Relation Age of Onset   . Diabetes Mother   . Colonic polyp Mother   . Asthma Mother   . Heart murmur Mother   . Arrhythmia Mother   . Heart attack Father   . Colonic polyp Father   . Arthritis Father   . Hypertension Father   . Hyperlipidemia Father   . Hearing loss Father   . Diabetes Maternal Grandmother   . Heart attack Maternal Grandfather   . Cancer Paternal Grandmother   . Early death Paternal Grandfather   . Colon polyps Sister     SOCHx:   reports that he has never smoked. He has never used smokeless tobacco. He reports current alcohol use. He reports previous drug use.  ALLERGIES:  No Known Allergies  ROS: Pertinent items noted in HPI and remainder of comprehensive ROS otherwise negative.  HOME MEDS: No current outpatient medications on file prior to visit.   No current facility-administered medications on file prior to visit.    LABS/IMAGING: No results found for this or any previous visit (from the past 48 hour(s)). No results found.  LIPID PANEL:    Component Value Date/Time   CHOL 182 09/11/2020 1126   TRIG 52.0 09/11/2020 1126   HDL 57.60 09/11/2020 1126   CHOLHDL 3 09/11/2020 1126   VLDL 10.4 09/11/2020 1126   LDLCALC 114 (H) 09/11/2020 1126    WEIGHTS: Wt Readings from  Last 3 Encounters:  10/04/20 176 lb 3.2 oz (79.9 kg)  09/11/20 169 lb (76.7 kg)  09/03/19 159 lb (72.1 kg)    VITALS: BP 136/84 (BP Location: Right Arm)   Pulse (!) 51   Ht 5\' 9"  (1.753 m)   Wt 176 lb 3.2 oz (79.9 kg)   BMI 26.02 kg/m   EXAM: General appearance: alert and no distress Neck: no carotid bruit, no JVD and thyroid not enlarged, symmetric, no tenderness/mass/nodules Lungs: clear to auscultation bilaterally Heart: regular rate and rhythm, S1, S2 normal, no murmur, click, rub or gallop Abdomen: soft, non-tender; bowel sounds normal; no masses,  no organomegaly Extremities: extremities normal, atraumatic, no cyanosis or edema Pulses: 2+ and symmetric Skin: Skin color, texture,  turgor normal. No rashes or lesions Neurologic: Grossly normal Psych: Pleasant  EKG: Sinus bradycardia 51-personally reviewed  ASSESSMENT: 1. History of congenital heart disease-suspect ASD 2. Atypical chest pain 3. Family history of coronary disease  PLAN: 1.   Mr. Meader had a history of congenital heart disease and was followed for a number of years by a pediatric cardiologist, I presume and was released.  He has not had further reassessment of that.  He does not have any murmur suggestive of VSD but could have a persistent ASD.  I have advised a saline contrast echo to further assess this.  As he has been having chest discomfort which is somewhat atypical, we will combine that with exercise stress echocardiography to rule out any possible ischemia.  The bubble study could be performed with rest imaging and subsequently he could undergo stress assessment.  Plan follow-up with me afterwards to discuss the findings.  We have also discussed ways to reduce cardiovascular risk including dietary alterations, physical activity and stress management.  Thanks again for the kind referral.  Pixie Casino, MD, FACC, [place] Director of the [facility] Diplomate of the American Board of Clinical Lipidology Attending Cardiologist  Direct Dial: [phone]  Fax: [phone]  Website:  [email or web address] 10/04/2020, 3:48 PM

## 2020-10-04 NOTE — Patient Instructions (Addendum)
Medication Instructions:  Your physician recommends that you continue on your current medications as directed. Please refer to the Current Medication list given to you today.  *If you need a refill on your cardiac medications before your next appointment, please call your pharmacy*   Testing/Procedures: Dr. Debara Pickett has ordered a stress echocardiogram  This is done at [address] will need to have the coronavirus test completed prior to your procedure. An appointment has been made at _______ on  _______. This is a Drive Up Visit at the [address] [place]. Please tell them that you are there for pre-procedure testing. Someone will direct you to the appropriate testing line. Stay in your car and someone will be with you shortly. Please make sure to have all other labs completed before this test because you will need to stay quarantined until your procedure. Please take your insurance card to this test.     Follow-Up: At [facility], you and your health needs are our priority.  As part of our continuing mission to provide you with exceptional heart care, we have created designated Provider Care Teams.  These Care Teams include your primary Cardiologist (physician) and Advanced Practice Providers (APPs -  Physician Assistants and Nurse Practitioners) who all work together to provide you with the care you need, when you need it.  We recommend signing up for the patient portal called "MyChart".  Sign up information is provided on this After Visit Summary.  MyChart is used to connect with patients for Virtual Visits (Telemedicine).  Patients are able to view lab/test results, encounter notes, upcoming appointments, etc.  Non-urgent messages can be sent to your provider as well.   To learn more about what you can do with MyChart, go to [email or web address].    Your next appointment:   After testing -- 1-2 months  The format for your next appointment:   In  Person  Provider:   You may see Dr. Debara Pickett or one of the following Advanced Practice Providers on your designated Care Team:    Almyra Deforest, PA-C  Fabian Sharp, PA-C or   Roby Lofts, [place]    Other Instructions CPT code - stress echo with bubble study - 93350, [phone]  CPT code - coronary CT with FFR - 75574, [phone]

## 2020-10-10 DIAGNOSIS — D225 Melanocytic nevi of trunk: Secondary | ICD-10-CM | POA: Diagnosis not present

## 2020-10-10 DIAGNOSIS — L821 Other seborrheic keratosis: Secondary | ICD-10-CM | POA: Diagnosis not present

## 2020-10-10 DIAGNOSIS — D485 Neoplasm of uncertain behavior of skin: Secondary | ICD-10-CM | POA: Diagnosis not present

## 2020-10-10 DIAGNOSIS — D1801 Hemangioma of skin and subcutaneous tissue: Secondary | ICD-10-CM | POA: Diagnosis not present

## 2020-10-13 ENCOUNTER — Encounter: Payer: Self-pay | Admitting: Gastroenterology

## 2020-10-25 ENCOUNTER — Telehealth (HOSPITAL_COMMUNITY): Payer: Self-pay | Admitting: *Deleted

## 2020-10-25 NOTE — Telephone Encounter (Signed)
Patient given detailed instructions per Stress Test Requisition Sheet for test on 10/30/20 at 2:00.Patient Notified to arrive 30 minutes early, and that it is imperative to arrive on time for appointment to keep from having the test rescheduled.  Patient verbalized understanding. Veronia Beets

## 2020-10-26 ENCOUNTER — Other Ambulatory Visit (HOSPITAL_COMMUNITY)
Admission: RE | Admit: 2020-10-26 | Discharge: 2020-10-26 | Disposition: A | Discharge status: Home / Self Care | Payer: Federal, State, Local not specified - PPO | Source: Ambulatory Visit | Attending: Internal Medicine | Admitting: Internal Medicine

## 2020-10-26 ENCOUNTER — Other Ambulatory Visit: Payer: Self-pay | Admitting: Internal Medicine

## 2020-10-26 DIAGNOSIS — Z8774 Personal history of (corrected) congenital malformations of heart and circulatory system: Secondary | ICD-10-CM

## 2020-10-26 DIAGNOSIS — R079 Chest pain, unspecified: Secondary | ICD-10-CM

## 2020-10-26 DIAGNOSIS — Z20822 Contact with and (suspected) exposure to covid-19: Secondary | ICD-10-CM | POA: Insufficient documentation

## 2020-10-26 DIAGNOSIS — Z01812 Encounter for preprocedural laboratory examination: Secondary | ICD-10-CM | POA: Diagnosis not present

## 2020-10-26 DIAGNOSIS — Z8249 Family history of ischemic heart disease and other diseases of the circulatory system: Secondary | ICD-10-CM

## 2020-10-26 LAB — SARS CORONAVIRUS 2 (TAT 6-24 HRS): SARS Coronavirus 2: NEGATIVE

## 2020-10-30 ENCOUNTER — Other Ambulatory Visit: Payer: Self-pay

## 2020-10-30 ENCOUNTER — Ambulatory Visit (HOSPITAL_COMMUNITY): Payer: Federal, State, Local not specified - PPO | Attending: Cardiology

## 2020-10-30 ENCOUNTER — Ambulatory Visit (HOSPITAL_COMMUNITY): Payer: Federal, State, Local not specified - PPO

## 2020-10-30 DIAGNOSIS — R079 Chest pain, unspecified: Secondary | ICD-10-CM | POA: Diagnosis not present

## 2020-10-30 LAB — ECHOCARDIOGRAM STRESS TEST: Area-P 1/2: 2.31 cm2

## 2020-10-30 MED ORDER — SODIUM CHLORIDE 0.9% FLUSH: 16.0000 mL | INTRAVENOUS | Status: DC | PRN

## 2020-12-07 ENCOUNTER — Ambulatory Visit (AMBULATORY_SURGERY_CENTER): Payer: Self-pay | Admitting: *Deleted

## 2020-12-07 ENCOUNTER — Telehealth: Payer: Self-pay | Admitting: *Deleted

## 2020-12-07 ENCOUNTER — Other Ambulatory Visit: Payer: Self-pay

## 2020-12-07 VITALS — Ht 69.0 in | Wt 176.0 lb

## 2020-12-07 DIAGNOSIS — Z01818 Encounter for other preprocedural examination: Secondary | ICD-10-CM

## 2020-12-07 DIAGNOSIS — Z8371 Family history of colonic polyps: Secondary | ICD-10-CM

## 2020-12-07 NOTE — Telephone Encounter (Signed)
Marie,  This pt is cleared for anesthetic care at [facility].  Thanks,  Jenna Ardoin 

## 2020-12-07 NOTE — Progress Notes (Signed)
No egg or soy allergy known to patient  No issues with past sedation with any surgeries or procedures No intubation problems in the past  No FH of Malignant Hyperthermia No diet pills per patient No home 02 use per patient  No blood thinners per patient  Pt denies issues with constipation  No A fib or A flutter  EMMI video to pt or via [place] 19 guidelines implemented in PV today with Pt and RN   Pt is not  fully vaccinated  for Covid 1-21 covid test  830 am   10-30-2020 had a normal stress test- No ASd, no VSD noted - pt will follow up 12-25-2020 with Cardio  - there are no further testing scheduled- pt states had some chest discomfort- cardio thinks musculoskeletal issues - no further testing needed at this point   Due to the COVID-19 pandemic we are asking patients to follow certain guidelines.  Pt aware of COVID protocols and [facility] guidelines

## 2020-12-07 NOTE — Telephone Encounter (Signed)
Scott Gregory,  10-30-2020 had a normal stress test- No ASd, no VSD noted - pt will follow up 12-25-2020 with Cardio  - there are no further testing scheduled- pt states had some chest discomfort- cardio thinks musculoskeletal issues - no further testing needed at this point   [facility]

## 2020-12-13 ENCOUNTER — Other Ambulatory Visit: Payer: Self-pay | Admitting: Physician Assistant

## 2020-12-13 DIAGNOSIS — F419 Anxiety disorder, unspecified: Secondary | ICD-10-CM

## 2020-12-13 NOTE — Telephone Encounter (Signed)
Lorazepam last rx 06/09/20 #10 1 RF LOV: 09/11/20 CPE No CSC

## 2020-12-15 ENCOUNTER — Other Ambulatory Visit: Payer: Self-pay | Admitting: Gastroenterology

## 2020-12-15 DIAGNOSIS — Z1159 Encounter for screening for other viral diseases: Secondary | ICD-10-CM | POA: Diagnosis not present

## 2020-12-16 LAB — SARS CORONAVIRUS 2 (TAT 6-24 HRS): SARS Coronavirus 2: NEGATIVE

## 2020-12-19 ENCOUNTER — Encounter: Payer: Self-pay | Admitting: Gastroenterology

## 2020-12-19 ENCOUNTER — Other Ambulatory Visit: Payer: Self-pay | Admitting: Gastroenterology

## 2020-12-19 ENCOUNTER — Other Ambulatory Visit: Payer: Self-pay

## 2020-12-19 ENCOUNTER — Ambulatory Visit (AMBULATORY_SURGERY_CENTER): Payer: Federal, State, Local not specified - PPO | Admitting: Gastroenterology

## 2020-12-19 VITALS — BP 111/54 | HR 51 | Temp 98.5°F | Resp 16 | Ht 69.0 in | Wt 176.0 lb

## 2020-12-19 DIAGNOSIS — D122 Benign neoplasm of ascending colon: Secondary | ICD-10-CM

## 2020-12-19 DIAGNOSIS — D127 Benign neoplasm of rectosigmoid junction: Secondary | ICD-10-CM | POA: Diagnosis not present

## 2020-12-19 DIAGNOSIS — K621 Rectal polyp: Secondary | ICD-10-CM | POA: Diagnosis not present

## 2020-12-19 DIAGNOSIS — K635 Polyp of colon: Secondary | ICD-10-CM

## 2020-12-19 DIAGNOSIS — Z1211 Encounter for screening for malignant neoplasm of colon: Secondary | ICD-10-CM

## 2020-12-19 DIAGNOSIS — D128 Benign neoplasm of rectum: Secondary | ICD-10-CM

## 2020-12-19 DIAGNOSIS — Z8371 Family history of colonic polyps: Secondary | ICD-10-CM

## 2020-12-19 MED ORDER — HYDROCORTISONE ACETATE 25 MG RE SUPP: RECTAL | 1 refills | Status: DC

## 2020-12-19 MED ORDER — SODIUM CHLORIDE 0.9 % IV SOLN: 500.0000 mL | Freq: Once | INTRAVENOUS | Status: DC

## 2020-12-19 NOTE — Progress Notes (Signed)
Report given to PACU, vss 

## 2020-12-19 NOTE — Patient Instructions (Signed)
Handout on polyps.  High fiber diet , use fiber con 1-2 tablets daily.     YOU HAD AN ENDOSCOPIC PROCEDURE TODAY AT [facility]:   Refer to the procedure report that was given to you for any specific questions about what was found during the examination.  If the procedure report does not answer your questions, please call your gastroenterologist to clarify.  If you requested that your care partner not be given the details of your procedure findings, then the procedure report has been included in a sealed envelope for you to review at your convenience later.  YOU SHOULD EXPECT: Some feelings of bloating in the abdomen. Passage of more gas than usual.  Walking can help get rid of the air that was put into your GI tract during the procedure and reduce the bloating. If you had a lower endoscopy (such as a colonoscopy or flexible sigmoidoscopy) you may notice spotting of blood in your stool or on the toilet paper. If you underwent a bowel prep for your procedure, you may not have a normal bowel movement for a few days.  Please Note:  You might notice some irritation and congestion in your nose or some drainage.  This is from the oxygen used during your procedure.  There is no need for concern and it should clear up in a day or so.  SYMPTOMS TO REPORT IMMEDIATELY:   Following lower endoscopy (colonoscopy or flexible sigmoidoscopy):  Excessive amounts of blood in the stool  Significant tenderness or worsening of abdominal pains  Swelling of the abdomen that is new, acute  Fever of 100F or higher  For urgent or emergent issues, a gastroenterologist can be reached at any hour by calling [phone]. Do not use MyChart messaging for urgent concerns.    DIET:  We do recommend a small meal at first, but then you may proceed to your regular diet.  Drink plenty of fluids but you should avoid alcoholic beverages for 24 hours.  ACTIVITY:  You should plan to take it easy for the rest of  today and you should NOT DRIVE or use heavy machinery until tomorrow (because of the sedation medicines used during the test).    FOLLOW UP: Our staff will call the number listed on your records 48-72 hours following your procedure to check on you and address any questions or concerns that you may have regarding the information given to you following your procedure. If we do not reach you, we will leave a message.  We will attempt to reach you two times.  During this call, we will ask if you have developed any symptoms of COVID 19. If you develop any symptoms (ie: fever, flu-like symptoms, shortness of breath, cough etc.) before then, please call [phone].  If you test positive for Covid 19 in the 2 weeks post procedure, please call and report this information to [place].    If any biopsies were taken you will be contacted by phone or by letter within the next 1-3 weeks.  Please call us at [phone] if you have not heard about the biopsies in 3 weeks.    SIGNATURES/CONFIDENTIALITY: You and/or your care partner have signed paperwork which will be entered into your electronic medical record.  These signatures attest to the fact that that the information above on your After Visit Summary has been reviewed and is understood.  Full responsibility of the confidentiality of this discharge information lies with you and/or your care-partner.

## 2020-12-19 NOTE — Progress Notes (Signed)
Called to room to assist during endoscopic procedure.  Patient ID and intended procedure confirmed with present staff. Received instructions for my participation in the procedure from the performing physician.  

## 2020-12-19 NOTE — Op Note (Signed)
[facility] Patient Name: Scott Gregory Procedure Date: 12/19/2020 9:34 AM MRN: [id number] Endoscopist: Gabriel Mansouraty , MD Age: 45 Referring MD:  Date of Birth: 11/22/1976 Gender: Male Account #: [id number] Procedure:                Colonoscopy Indications:              Screening for colorectal malignant neoplasm, This                            is the patient's first colonoscopy Medicines:                Monitored Anesthesia Care Procedure:                Pre-Anesthesia Assessment:                           - Prior to the procedure, a History and Physical                            was performed, and patient medications and                            allergies were reviewed. The patient's tolerance of                            previous anesthesia was also reviewed. The risks                            and benefits of the procedure and the sedation                            options and risks were discussed with the patient.                            All questions were answered, and informed consent                            was obtained. Prior Anticoagulants: The patient has                            taken no previous anticoagulant or antiplatelet                            agents. ASA Grade Assessment: II - A patient with                            mild systemic disease. After reviewing the risks                            and benefits, the patient was deemed in                            satisfactory condition to undergo the procedure.                             After obtaining informed consent, the colonoscope                            was passed under direct vision. Throughout the                            procedure, the patient's blood pressure, pulse, and                            oxygen saturations were monitored continuously. The                            Olympus CF-[id number] (#[id number]) Colonoscope was                            introduced through the anus  and advanced to the 5                            cm into the ileum. The colonoscopy was performed                            without difficulty. The patient tolerated the                            procedure. The quality of the bowel preparation was                            good. The terminal ileum, ileocecal valve,                            appendiceal orifice, and rectum were photographed.                           Images captured on Media Tab. Scope In: 9:39:42 AM Scope Out: 10:00:16 AM Scope Withdrawal Time: 0 hours 16 minutes 24 seconds  Total Procedure Duration: 0 hours 20 minutes 34 seconds  Findings:                 The digital rectal exam findings include                            hemorrhoids. Pertinent negatives include no                            palpable rectal lesions.                           The terminal ileum and ileocecal valve appeared                            normal.                           Four sessile polyps were found in the rectum (1),                              recto-sigmoid colon (1) and ascending colon (2).                            The polyps were 5 to 15 mm in size. These polyps                            were removed with a cold snare. Resection and                            retrieval were complete.                           One small submucosal nodule was found at the                            hepatic flexure.                           Normal mucosa was found in the entire colon                            otherwise.                           Non-bleeding non-thrombosed external and internal                            hemorrhoids were found during retroflexion, during                            perianal exam and during digital exam. The                            hemorrhoids were Grade II (internal hemorrhoids                            that prolapse but reduce spontaneously). Complications:            No immediate complications. Estimated  Blood Loss:     Estimated blood loss was minimal. Impression:               - Hemorrhoids found on digital rectal exam.                           - The examined portion of the ileum was normal.                           - Four 5 to 15 mm polyps in the rectum, at the                            recto-sigmoid colon and in the ascending colon,                            removed with a cold snare. Resected and retrieved.                           -  Submucosal nodule at the hepatic flexure.                           - Normal mucosa in the entire examined colon                            otherwise.                           - Non-bleeding non-thrombosed external and internal                            hemorrhoids. Recommendation:           - The patient will be observed post-procedure,                            until all discharge criteria are met.                           - Discharge patient to home.                           - Patient has a contact number available for                            emergencies. The signs and symptoms of potential                            delayed complications were discussed with the                            patient. Return to normal activities tomorrow.                            Written discharge instructions were provided to the                            patient.                           - High fiber diet.                           - Use FiberCon 1-2 tablets PO daily.                           - Continue present medications.                           - Await pathology results.                           - Repeat colonoscopy in 3 years for surveillance.                           - The findings and recommendations were discussed                              with the patient.                           - The findings and recommendations were discussed                            with the patient's family. Justice Britain, MD 12/19/2020 10:10:04 AM

## 2020-12-19 NOTE — Progress Notes (Signed)
VS by CW  I have reviewed the patient's medical history in detail and updated the computerized patient record.  

## 2020-12-21 ENCOUNTER — Telehealth (HOSPITAL_COMMUNITY): Payer: Self-pay

## 2020-12-21 NOTE — Telephone Encounter (Signed)
  Follow up Call-  Call back number 12/19/2020  Post procedure Call Back phone  # [phone]  Permission to leave phone message Yes     Patient questions:  Do you have a fever, pain , or abdominal swelling? No. Pain Score  0 *  Have you tolerated food without any problems? Yes.    Have you been able to return to your normal activities? Yes.    Do you have any questions about your discharge instructions: Diet   No. Medications  No. Follow up visit  No.  Do you have questions or concerns about your Care? No.  Actions: * If pain score is 4 or above: No action needed, pain <4.  1. Have you developed a fever since your procedure? no  2.   Have you had an respiratory symptoms (SOB or cough) since your procedure? no  3.   Have you tested positive for COVID 19 since your procedure no  4.   Have you had any family members/close contacts diagnosed with the COVID 19 since your procedure?  no   If yes to any of these questions please route to Joylene John, RN and Joella Prince, RN

## 2020-12-25 ENCOUNTER — Ambulatory Visit: Payer: Federal, State, Local not specified - PPO | Admitting: Internal Medicine

## 2020-12-30 ENCOUNTER — Encounter: Payer: Self-pay | Admitting: Gastroenterology

## 2021-02-06 DIAGNOSIS — M9902 Segmental and somatic dysfunction of thoracic region: Secondary | ICD-10-CM | POA: Diagnosis not present

## 2021-02-06 DIAGNOSIS — M542 Cervicalgia: Secondary | ICD-10-CM | POA: Diagnosis not present

## 2021-02-06 DIAGNOSIS — M9901 Segmental and somatic dysfunction of cervical region: Secondary | ICD-10-CM | POA: Diagnosis not present

## 2021-02-06 DIAGNOSIS — M9908 Segmental and somatic dysfunction of rib cage: Secondary | ICD-10-CM | POA: Diagnosis not present

## 2021-02-12 ENCOUNTER — Telehealth: Payer: Self-pay | Admitting: Internal Medicine

## 2021-02-12 NOTE — Telephone Encounter (Signed)
Scott Gregory is calling due to having to cancel his upcoming appointment. He is wanting to know if the appointment can be made virtual before rescheduling. Please callback to confirm.

## 2021-02-12 NOTE — Telephone Encounter (Signed)
Returned call to patient, rescheduled appt for VV on a virtual clinic day.   4/4 at 8:45 AM with Dr. Debara Pickett.

## 2021-02-16 ENCOUNTER — Ambulatory Visit: Payer: Federal, State, Local not specified - PPO | Admitting: Internal Medicine

## 2021-02-28 ENCOUNTER — Encounter: Payer: Self-pay | Admitting: Internal Medicine

## 2021-02-28 ENCOUNTER — Telehealth (INDEPENDENT_AMBULATORY_CARE_PROVIDER_SITE_OTHER): Payer: Federal, State, Local not specified - PPO | Admitting: Internal Medicine

## 2021-02-28 DIAGNOSIS — R079 Chest pain, unspecified: Secondary | ICD-10-CM | POA: Diagnosis not present

## 2021-02-28 DIAGNOSIS — Z8249 Family history of ischemic heart disease and other diseases of the circulatory system: Secondary | ICD-10-CM

## 2021-02-28 DIAGNOSIS — Z8774 Personal history of (corrected) congenital malformations of heart and circulatory system: Secondary | ICD-10-CM | POA: Diagnosis not present

## 2021-02-28 NOTE — Progress Notes (Signed)
Virtual Visit via Telephone Note   This visit type was conducted due to national recommendations for restrictions regarding the COVID-19 Pandemic (e.g. social distancing) in an effort to limit this patient's exposure and mitigate transmission in our community.  Due to his co-morbid illnesses, this patient is at least at moderate risk for complications without adequate follow up.  This format is felt to be most appropriate for this patient at this time.  The patient did not have access to video technology/had technical difficulties with video requiring transitioning to audio format only (telephone).  All issues noted in this document were discussed and addressed.  No physical exam could be performed with this format.  Please refer to the patient's chart for his  consent to telehealth for [facility].   Date:  02/28/2021   ID:  Scott Gregory, DOB 12/30/1975, MRN [id number] The patient was identified using 2 identifiers.  Evaluation Performed:  Follow-Up Visit  Patient Location:  [place] [address]  Provider location:   [address] [place], [place] [address]  PCP:  No primary care provider on file.  Cardiologist:  No primary care provider on file. Electrophysiologist:  None   Chief Complaint:Telephone follow-up  History of Present Illness:    Scott Gregory is a 45 y.o. male who presents via audio/video conferencing for a telehealth visit today.  Scott Gregory today for follow-up.  November 2021 for chest pain.  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  He also has a family history of atrial fibrillation and coronary artery disease.  He had been having some chest tightness but not necessarily associated with exertion and he was able to exercise at the gym.  He was then referred for an exercise stress echocardiogram.  He performed 12 metabolic equivalents of exercise and had no ischemic EKG or wall motion  abnormalities.  A bubble study was performed which was negative for interatrial shunting.  No evidence of ASD or VSD was noted.  Reassuring study.  Today he reports that his chest pain has resolved.  Has been working with a sports medicine physician and his symptoms may have been musculoskeletal.  The patient does not have symptoms concerning for COVID-19 infection (fever, chills, cough, or new SHORTNESS OF BREATH).    Prior CV studies:   The following studies were reviewed today:  Chart reviewed  PMHx:  Past Medical History:  Diagnosis Date  . Allergy   . GERD (gastroesophageal reflux disease)    Dietary and TLC related -- in 20s. No issues since stopping tobacco use    Past Surgical History:  Procedure Laterality Date  . BACK SURGERY    . WISDOM TOOTH EXTRACTION      FAMHx:  Family History  Problem Relation Age of Onset  . Diabetes Mother   . Colonic polyp Mother   . Asthma Mother   . Heart murmur Mother   . Arrhythmia Mother   . Heart attack Father   . Colonic polyp Father   . Arthritis Father   . Hypertension Father   . Hyperlipidemia Father   . Hearing loss Father   . Colon polyps Father   . Diabetes Maternal Grandmother   . Heart attack Maternal Grandfather   . Cancer Paternal Grandmother   . Early death Paternal Grandfather   . Colon polyps Sister   . Colon cancer Other   . Esophageal cancer Neg Hx   .  Rectal cancer Neg Hx   . Stomach cancer Neg Hx     SOCHx:   reports that he has never smoked. He has never used smokeless tobacco. He reports current alcohol use. He reports previous drug use.  ALLERGIES:  No Known Allergies  MEDS:  Current Meds  Medication Sig  . LORazepam (ATIVAN) 0.5 MG tablet TAKE 1 TABLET (0.5 MG TOTAL) BY MOUTH 2 (TWO) TIMES DAILY AS NEEDED FOR ANXIETY.  . meloxicam (MOBIC) 15 MG tablet Take 1 tablet by mouth as needed.  . Multiple Vitamins-Minerals (MULTIVITAMIN MEN 50+) TABS Take by mouth.     ROS: Pertinent items noted  in HPI and remainder of comprehensive ROS otherwise negative.  Labs/Other Tests and Data Reviewed:    Recent Labs: 09/11/2020: ALT 19; BUN 17; Creatinine, Ser 0.92; Potassium 4.3; Sodium 138   Recent Lipid Panel Lab Results  Component Value Date/Time   CHOL 182 09/11/2020 11:26 AM   TRIG 52.0 09/11/2020 11:26 AM   HDL 57.60 09/11/2020 11:26 AM   CHOLHDL 3 09/11/2020 11:26 AM   LDLCALC 114 (H) 09/11/2020 11:26 AM    Wt Readings from Last 3 Encounters:  12/19/20 176 lb (79.8 kg)  12/07/20 176 lb (79.8 kg)  10/04/20 176 lb 3.2 oz (79.9 kg)     Exam:    Vital Signs:  There were no vitals taken for this visit.   Performed due to telephone visit  ASSESSMENT & PLAN:    1. History of congenital heart disease-suspect ASD 2. Atypical chest pain 3. Family history of coronary disease  Scott Gregory had a negative stress echocardiogram which is reassuring.  There was no evidence of atrial septal defect with a negative bubble study.  His chest pain was atypical and has resolved.  He does have a strong family history of coronary disease.  His lipids were fairly normal-appearing and he is active and exercises regularly.  I encouraged him to continue to work on primary prevention including healthy diet, regular exercise and general cardiovascular risk reduction.  I am happy to see him back as needed.  COVID-19 Education: The signs and symptoms of COVID-19 were discussed with the patient and how to seek care for testing (follow up with PCP or arrange E-visit).  The importance of social distancing was discussed today.  Patient Risk:   After full review of this patients clinical status, I feel that they are at least moderate risk at this time.  Time:   Today, I have spent 15 minutes with the patient with telehealth technology discussing stress test result.     Medication Adjustments/Labs and Tests Ordered: Current medicines are reviewed at length with the patient today.  Concerns regarding  medicines are outlined above.   Tests Ordered: No orders of the defined types were placed in this encounter.   Medication Changes: No orders of the defined types were placed in this encounter.   Disposition:  prn  Pixie Casino, MD, FACC, [place] Director of the Advanced Lipid Disorders &  Cardiovascular Risk Reduction Clinic Diplomate of the American Board of Clinical Lipidology Attending Cardiologist  Direct Dial: [phone]  Fax: [phone]  Website:  [email or web address]  Pixie Casino, MD  02/28/2021 9:07 AM

## 2021-02-28 NOTE — Patient Instructions (Addendum)
Medication Instructions:  Your physician recommends that you continue on your current medications as directed. Please refer to the Current Medication list given to you today.  *If you need a refill on your cardiac medications before your next appointment, please call your pharmacy*   Follow-Up: At [facility], you and your health needs are our priority.  As part of our continuing mission to provide you with exceptional heart care, we have created designated Provider Care Teams.  These Care Teams include your primary Cardiologist (physician) and Advanced Practice Providers (APPs -  Physician Assistants and Nurse Practitioners) who all work together to provide you with the care you need, when you need it.  We recommend signing up for the patient portal called "MyChart".  Sign up information is provided on this After Visit Summary.  MyChart is used to connect with patients for Virtual Visits (Telemedicine).  Patients are able to view lab/test results, encounter notes, upcoming appointments, etc.  Non-urgent messages can be sent to your provider as well.   To learn more about what you can do with MyChart, go to [email or web address].    Your next appointment:   AS NEEDED with Dr. Mauri Brooklyn PCP  [place], [place]: [phone]  [place], [place] [address] [place]: [place] [address], [place] [address] [place]: [phone]

## 2021-07-05 ENCOUNTER — Telehealth: Payer: Self-pay

## 2021-07-05 ENCOUNTER — Other Ambulatory Visit: Payer: Self-pay

## 2021-07-05 DIAGNOSIS — F32A Depression, unspecified: Secondary | ICD-10-CM

## 2021-07-05 DIAGNOSIS — F419 Anxiety disorder, unspecified: Secondary | ICD-10-CM

## 2021-07-05 NOTE — Telephone Encounter (Signed)
Pt needs refill on LORazepam (ATIVAN) 0.5 MG tablet ,   [id number] [place], [place] - [address]   Pt call back [phone] is making a TOC appt

## 2021-07-05 NOTE — Telephone Encounter (Signed)
Pt needs refill on LORazepam (ATIVAN) 0.5 MG tablet ,    [id number] [place],  - [address]  LFD 12/13/20 #10 with no refills LOV 09/11/20 NOV 09/25/21

## 2021-07-05 NOTE — Telephone Encounter (Signed)
Request sent to provider.

## 2021-07-06 MED ORDER — LORAZEPAM 0.5 MG PO TABS: 0.5000 mg | ORAL_TABLET | Freq: Two times a day (BID) | ORAL | 0 refills | Status: DC | PRN

## 2021-07-09 ENCOUNTER — Encounter: Payer: Self-pay | Admitting: Registered Nurse

## 2021-07-09 ENCOUNTER — Other Ambulatory Visit: Payer: Self-pay

## 2021-07-09 ENCOUNTER — Ambulatory Visit: Payer: Federal, State, Local not specified - PPO | Admitting: Registered Nurse

## 2021-07-09 VITALS — BP 130/80 | HR 83 | Temp 98.0°F | Ht 69.0 in | Wt 157.8 lb

## 2021-07-09 DIAGNOSIS — G479 Sleep disorder, unspecified: Secondary | ICD-10-CM | POA: Diagnosis not present

## 2021-07-09 DIAGNOSIS — R4586 Emotional lability: Secondary | ICD-10-CM

## 2021-07-09 DIAGNOSIS — F418 Other specified anxiety disorders: Secondary | ICD-10-CM | POA: Diagnosis not present

## 2021-07-09 MED ORDER — FLUOXETINE HCL 20 MG PO CAPS: ORAL_CAPSULE | ORAL | 1 refills | Status: DC

## 2021-07-09 MED ORDER — HYDROXYZINE HCL 10 MG PO TABS: 10.0000 mg | ORAL_TABLET | Freq: Every evening | ORAL | 2 refills | Status: DC | PRN

## 2021-07-09 MED ORDER — CLONAZEPAM 0.5 MG PO TBDP: 0.5000 mg | ORAL_TABLET | Freq: Two times a day (BID) | ORAL | 0 refills | Status: DC

## 2021-07-09 NOTE — Patient Instructions (Signed)
Mr. Alyxander Malonzo to meet you. Sorry you're going through some tough times.  Let's get you started on the following:  Fluoxetine (Prozac) '20mg'$  capsules: take 1 daily in the morning. Can increase to 2 daily after 2-3 weeks if tolerating this well. This is going to help limit anxiety, manage depression, and hopefully help to settle some emotional lability and have you less quick to anger, anxiety, and other negative moods. This does need to be taken every day to have effect. I do not think you'll feel fundamentally different when taking this. This is in the same class of medications as meds like Zoloft, Lexapro, Celexa, and others.  Clonazepam (Klonopin) 0.5 mg sublingual tablets: take 1 tablet twice daily as needed for anxiety, anger, or labile mood. This is a fast acting medication. It may cause some sedation. It is a controlled substance so ideally this is something you need less and less as these next few weeks progress. Keep it out of reach of the kiddos. Hydroxyzine (Atarax/Vistaril) '10mg'$  tablets: take 1-2 tablets before bed as needed to help with sleep. This is an antihistamine - same category as Benadryl. Shouldn't knock you out, will be able to wake up and do whatever you need to should needs arise overnight.  I want to check in in 6 weeks or so to see how things are going. Can be in person or virtual (video chat).  Certainly let me know in the mean time if things aren't going as expected  Thank you  Rich
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
## 2021-08-20 ENCOUNTER — Ambulatory Visit: Payer: Federal, State, Local not specified - PPO | Admitting: Registered Nurse

## 2021-09-25 ENCOUNTER — Encounter: Payer: Federal, State, Local not specified - PPO | Admitting: Registered Nurse

## 2022-08-13 IMAGING — CR DG CHEST 2V
2 series · 2 of 2 positions shown · non-contrast
Comparison: None.

CLINICAL DATA: Left chest pain

EXAM:
CHEST - 2 VIEW

[w chest pa]
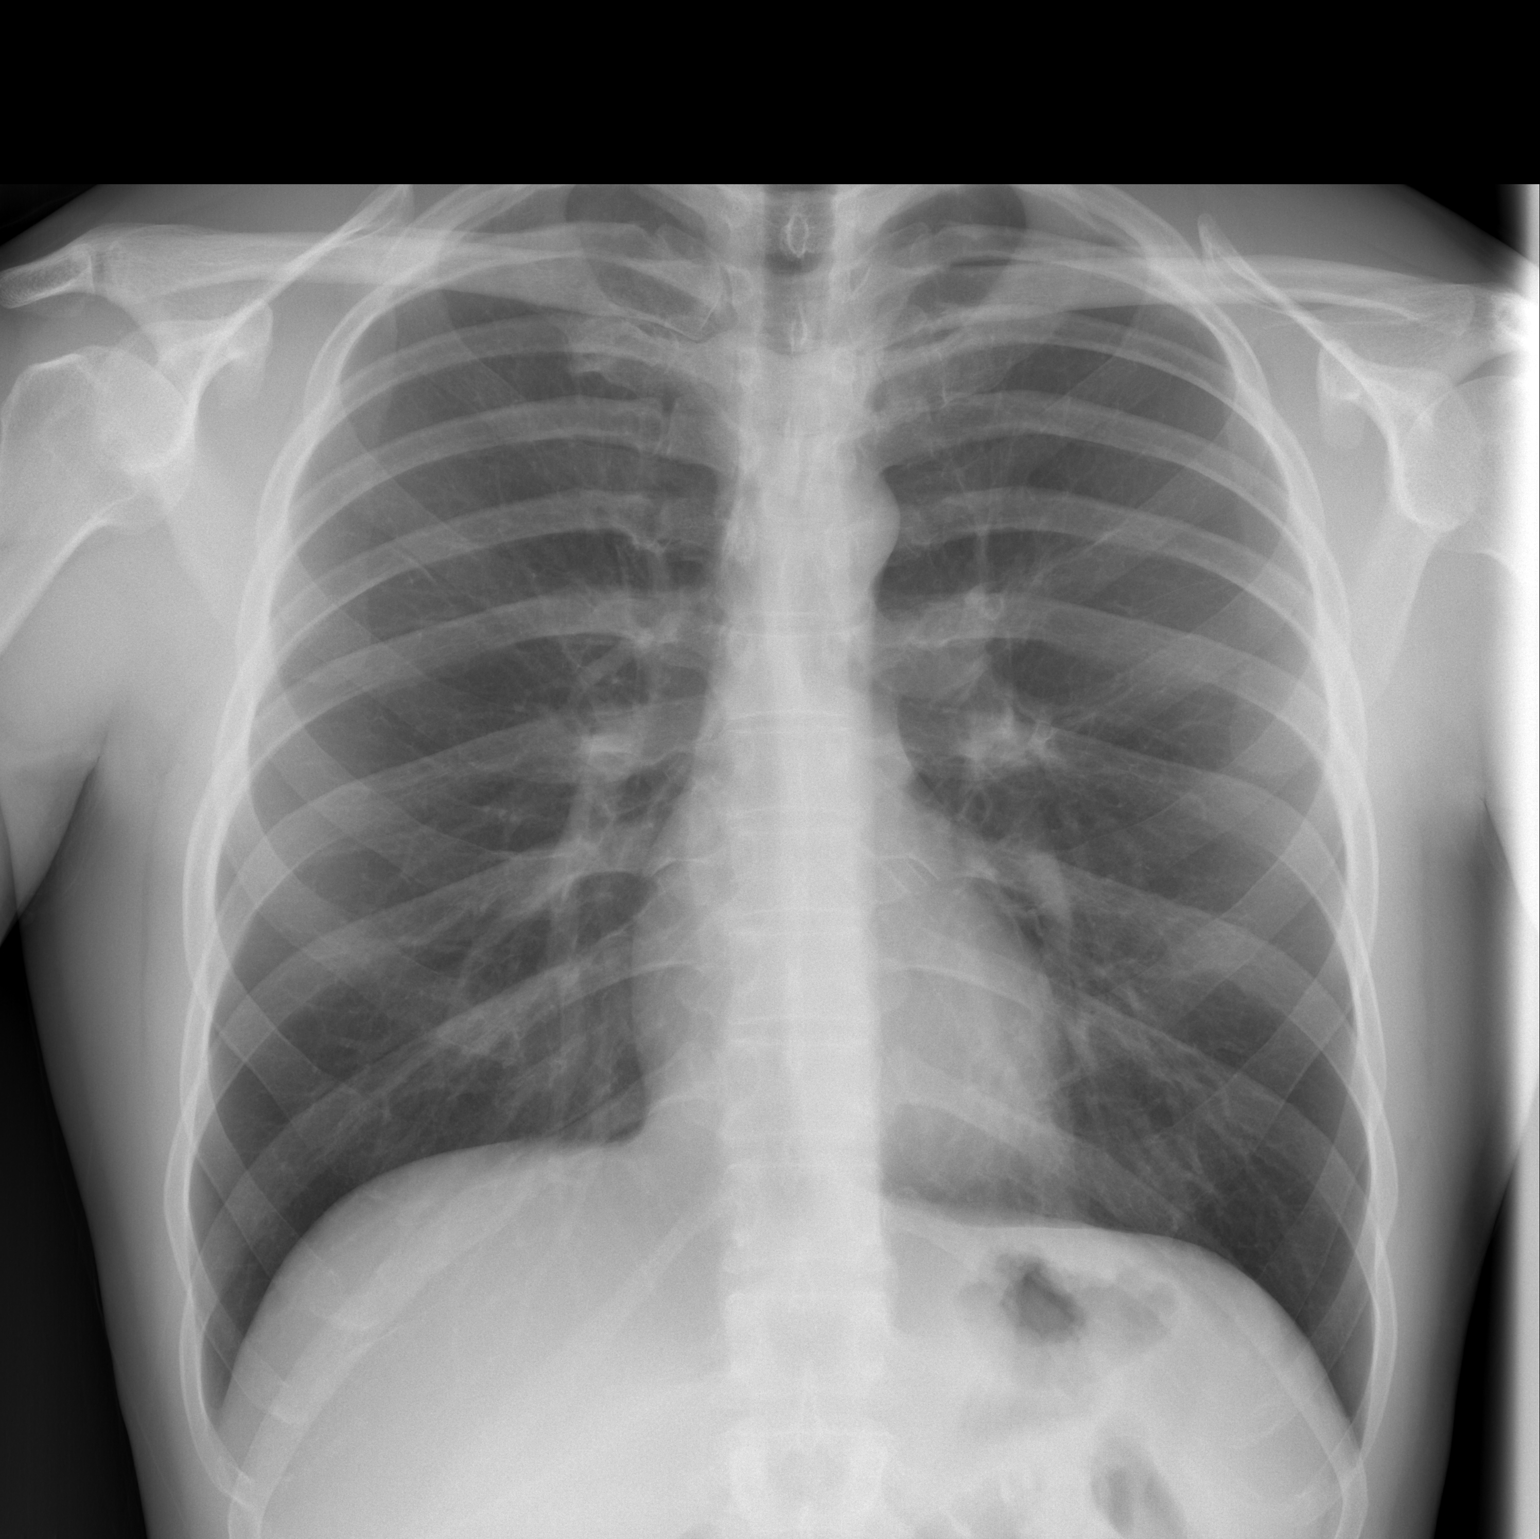

[w chest lat]
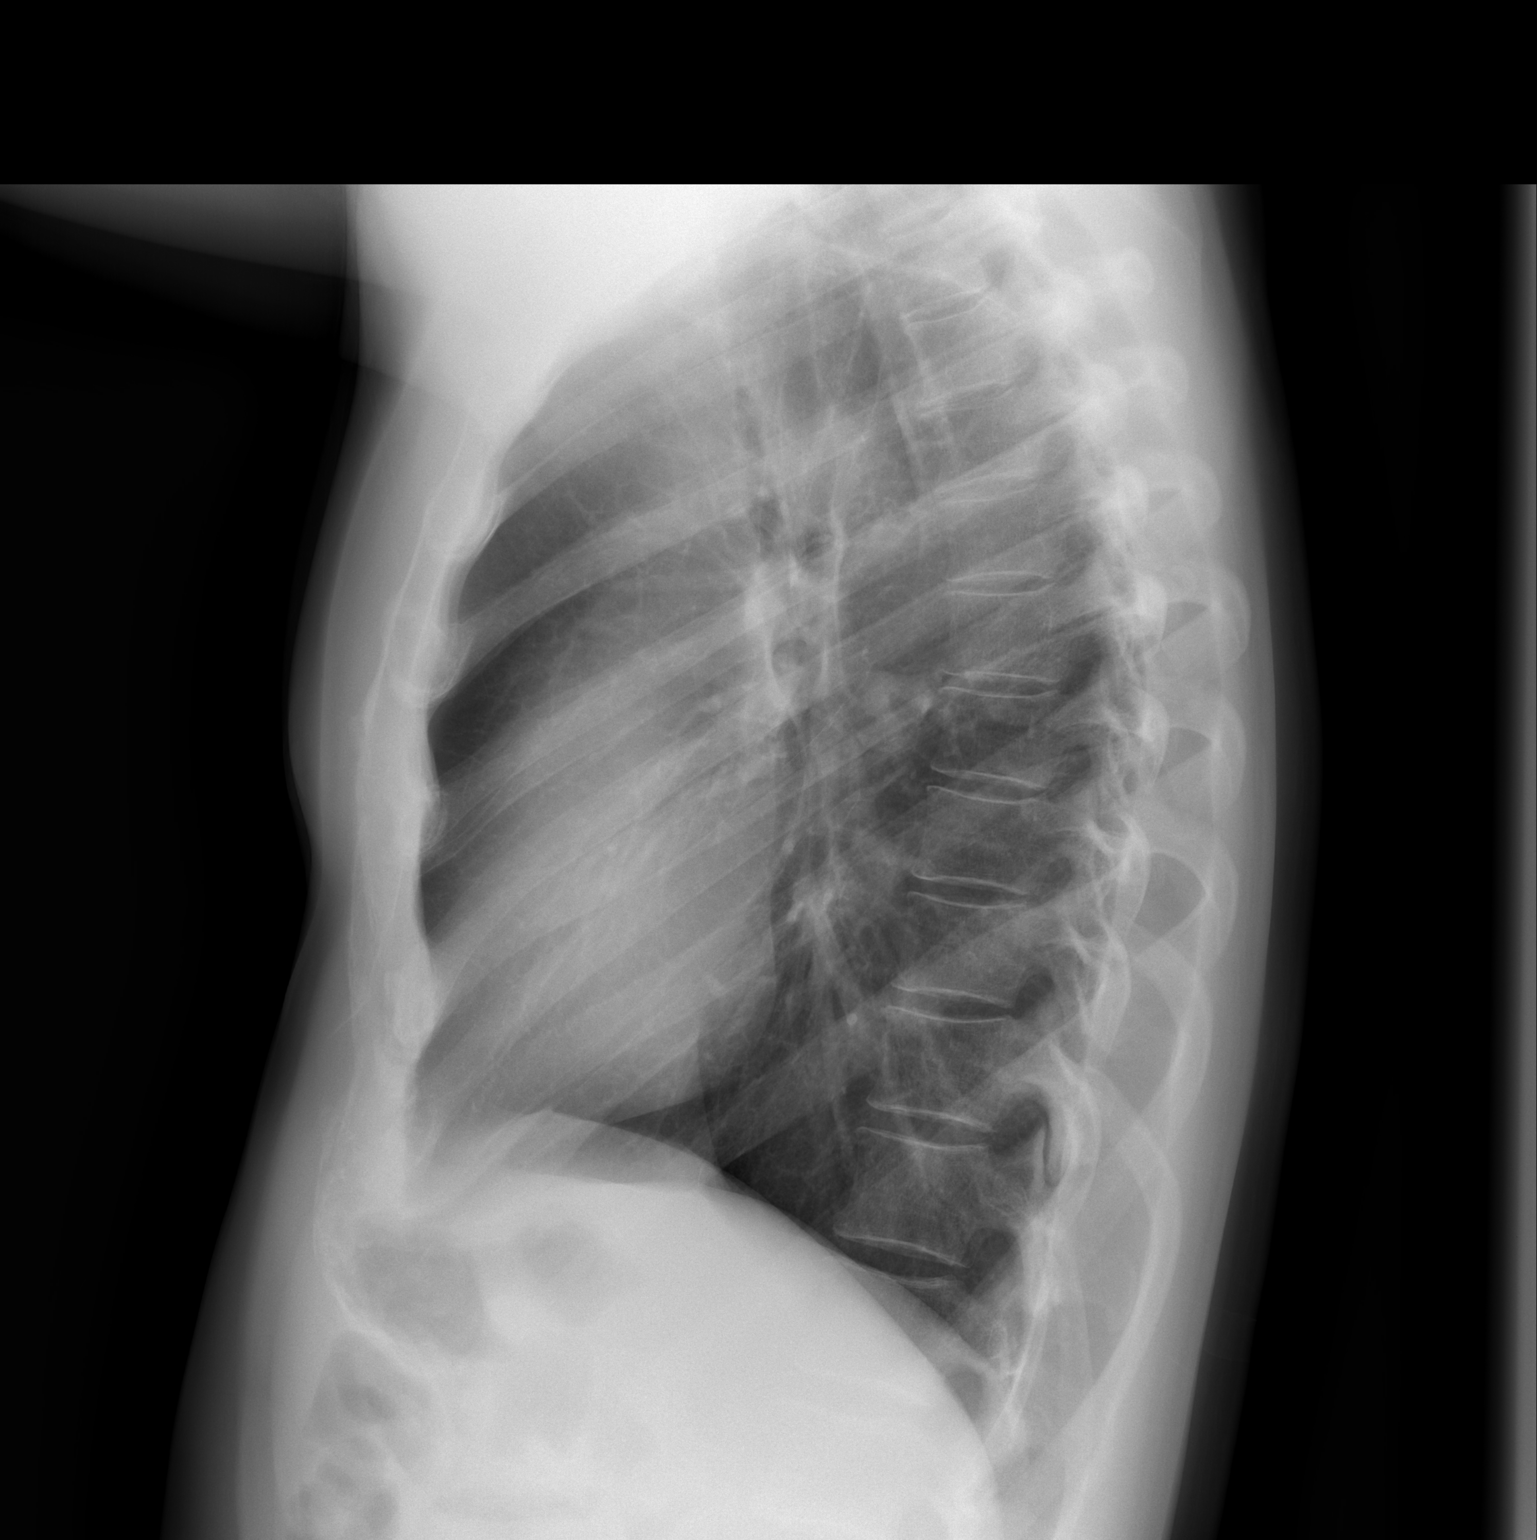

[2 of 2 positions shown; findings below may reference images not displayed]

FINDINGS: The heart size and mediastinal contours are within normal limits.
Both lungs are clear. The visualized skeletal structures are
unremarkable.
IMPRESSION: No active cardiopulmonary disease.

## 2023-03-21 DIAGNOSIS — F4310 Post-traumatic stress disorder, unspecified: Secondary | ICD-10-CM | POA: Diagnosis not present

## 2023-05-15 DIAGNOSIS — F4310 Post-traumatic stress disorder, unspecified: Secondary | ICD-10-CM | POA: Diagnosis not present

## 2023-05-21 DIAGNOSIS — F4310 Post-traumatic stress disorder, unspecified: Secondary | ICD-10-CM | POA: Diagnosis not present

## 2023-06-05 DIAGNOSIS — F4310 Post-traumatic stress disorder, unspecified: Secondary | ICD-10-CM | POA: Diagnosis not present

## 2023-06-12 DIAGNOSIS — F4310 Post-traumatic stress disorder, unspecified: Secondary | ICD-10-CM | POA: Diagnosis not present

## 2023-06-26 DIAGNOSIS — F4310 Post-traumatic stress disorder, unspecified: Secondary | ICD-10-CM | POA: Diagnosis not present

## 2023-07-10 DIAGNOSIS — F4310 Post-traumatic stress disorder, unspecified: Secondary | ICD-10-CM | POA: Diagnosis not present

## 2023-07-15 DIAGNOSIS — F4310 Post-traumatic stress disorder, unspecified: Secondary | ICD-10-CM | POA: Diagnosis not present

## 2023-07-21 ENCOUNTER — Encounter: Payer: Self-pay | Admitting: Family Medicine

## 2023-07-22 ENCOUNTER — Ambulatory Visit: Payer: Federal, State, Local not specified - PPO | Admitting: Family Medicine

## 2023-07-22 ENCOUNTER — Encounter: Payer: Self-pay | Admitting: Family Medicine

## 2023-07-22 ENCOUNTER — Telehealth: Payer: Self-pay

## 2023-07-22 VITALS — BP 122/64 | HR 67 | Temp 97.5°F | Ht 68.5 in | Wt 149.4 lb

## 2023-07-22 DIAGNOSIS — D696 Thrombocytopenia, unspecified: Secondary | ICD-10-CM | POA: Diagnosis not present

## 2023-07-22 DIAGNOSIS — Z833 Family history of diabetes mellitus: Secondary | ICD-10-CM | POA: Diagnosis not present

## 2023-07-22 DIAGNOSIS — Z125 Encounter for screening for malignant neoplasm of prostate: Secondary | ICD-10-CM

## 2023-07-22 DIAGNOSIS — Z1329 Encounter for screening for other suspected endocrine disorder: Secondary | ICD-10-CM

## 2023-07-22 DIAGNOSIS — F418 Other specified anxiety disorders: Secondary | ICD-10-CM

## 2023-07-22 DIAGNOSIS — Z13228 Encounter for screening for other metabolic disorders: Secondary | ICD-10-CM | POA: Diagnosis not present

## 2023-07-22 DIAGNOSIS — Z13220 Encounter for screening for lipoid disorders: Secondary | ICD-10-CM

## 2023-07-22 DIAGNOSIS — Z136 Encounter for screening for cardiovascular disorders: Secondary | ICD-10-CM | POA: Diagnosis not present

## 2023-07-22 DIAGNOSIS — Z7689 Persons encountering health services in other specified circumstances: Secondary | ICD-10-CM

## 2023-07-22 LAB — LIPID PANEL
Cholesterol: 183 mg/dL (ref 0–200)
HDL: 59.4 mg/dL (ref >39.00)
LDL Cholesterol: 105 mg/dL — ABNORMAL HIGH (ref 0–99)
NonHDL: 123.11
Total CHOL/HDL Ratio: 3
Triglycerides: 91 mg/dL (ref 0.0–149.0)
VLDL: 18.2 mg/dL (ref 0.0–40.0)

## 2023-07-22 LAB — CBC WITH DIFFERENTIAL/PLATELET
Basophils Absolute: 0 10*3/uL (ref 0.0–0.1)
Basophils Relative: 0.6 % (ref 0.0–3.0)
Eosinophils Absolute: 0.1 10*3/uL (ref 0.0–0.7)
Eosinophils Relative: 1.2 % (ref 0.0–5.0)
HCT: 42.3 % (ref 39.0–52.0)
Hemoglobin: 14.2 g/dL (ref 13.0–17.0)
Lymphocytes Relative: 22.9 % (ref 12.0–46.0)
Lymphs Abs: 1.1 10*3/uL (ref 0.7–4.0)
MCHC: 33.7 g/dL (ref 30.0–36.0)
MCV: 93.5 fl (ref 78.0–100.0)
Monocytes Absolute: 0.5 10*3/uL (ref 0.1–1.0)
Monocytes Relative: 9.7 % (ref 3.0–12.0)
Neutro Abs: 3.2 10*3/uL (ref 1.4–7.7)
Neutrophils Relative %: 65.6 % (ref 43.0–77.0)
Platelets: 128 10*3/uL — ABNORMAL LOW (ref 150.0–400.0)
RBC: 4.53 Mil/uL (ref 4.22–5.81)
RDW: 12.6 % (ref 11.5–15.5)
WBC: 4.9 10*3/uL (ref 4.0–10.5)

## 2023-07-22 LAB — PSA: PSA: 0.32 ng/mL (ref 0.10–4.00)

## 2023-07-22 LAB — COMPREHENSIVE METABOLIC PANEL
ALT: 17 U/L (ref 0–53)
AST: 19 U/L (ref 0–37)
Albumin: 4.4 g/dL (ref 3.5–5.2)
Alkaline Phosphatase: 47 U/L (ref 39–117)
BUN: 21 mg/dL (ref 6–23)
CO2: 32 mEq/L (ref 19–32)
Calcium: 9.6 mg/dL (ref 8.4–10.5)
Chloride: 99 mEq/L (ref 96–112)
Creatinine, Ser: 1.03 mg/dL (ref 0.40–1.50)
GFR: 86.07 mL/min (ref >60.00)
Glucose, Bld: 81 mg/dL (ref 70–99)
Potassium: 4.1 mEq/L (ref 3.5–5.1)
Sodium: 139 mEq/L (ref 135–145)
Total Bilirubin: 1 mg/dL (ref 0.2–1.2)
Total Protein: 6.5 g/dL (ref 6.0–8.3)

## 2023-07-22 LAB — TSH: TSH: 1.69 u[IU]/mL (ref 0.35–5.50)

## 2023-07-22 LAB — HEMOGLOBIN A1C: Hgb A1c MFr Bld: 5.1 % (ref 4.6–6.5)

## 2023-07-22 NOTE — Patient Instructions (Signed)
-  It was a pleasure to meet you today and look forward to taking care of you. -Ordered screening labs. Office will call with lab results and you may see them in MyChart.  -Declines covid vaccine, influenza vaccine, and HIV screening. -Declines any medication for anxiety and depression.  -Follow up in 1 year for a physical.

## 2023-07-22 NOTE — Telephone Encounter (Signed)
-----   Message from Zandra Abts sent at 07/22/2023  2:56 PM EDT ----- Labs are stable. No concern.

## 2023-07-22 NOTE — Progress Notes (Signed)
New Patient Office Visit  Subjective    Patient ID: Scott Gregory, male    DOB: 02-17-1976  Age: 47 y.o. MRN: [id number]  CC:  Chief Complaint  Patient presents with   Establish Care    Pt is here today to Est Care Pt reports he is only taking Vitamins at this time Pt reports he would like labs done today. Pt reports FASTING today.    HPI Scott Gregory presents to establish care with new provider.   Patients previous primary care provider is Janeece Agee, NP. Last visit was 07/09/2021.   Specialist: Counseling: [facility] in [place]  Anxiety with depression: Patient was prescribed Fluoxetine 20mg , but reports he stopped taking it shortly after starting medication. Denies SI or HI. Patient scored 13 on his PHQ-9 and scored 7 on his GAD-7. He does not feel he needs to take any medication for his anxiety/depression.  Outpatient Encounter Medications as of 07/22/2023  Medication Sig   Multiple Vitamins-Minerals (ONE-A-DAY MENS, MINERALS, PO) Take by mouth daily.   Omega-3 Fatty Acids (FISH OIL) 500 MG CAPS Take 1 capsule by mouth daily.   VITAMIN D PO Take 5,000 Int'l Units/oz by mouth daily.   FLUoxetine (PROZAC) 20 MG capsule Take 1 capsule (20 mg total) by mouth daily for 21 days, THEN 2 capsules (40 mg total) daily.   [DISCONTINUED] clonazePAM (KLONOPIN) 0.5 MG disintegrating tablet Take 1 tablet (0.5 mg total) by mouth 2 (two) times daily. (Patient not taking: Reported on 07/22/2023)   [DISCONTINUED] hydrOXYzine (ATARAX/VISTARIL) 10 MG tablet Take 1-2 tablets (10-20 mg total) by mouth at bedtime as needed. (Patient not taking: Reported on 07/22/2023)   [DISCONTINUED] LORazepam (ATIVAN) 0.5 MG tablet Take 1 tablet (0.5 mg total) by mouth 2 (two) times daily as needed for anxiety. (Patient not taking: Reported on 07/22/2023)   [DISCONTINUED] meloxicam (MOBIC) 15 MG tablet Take 1 tablet by mouth as needed. (Patient not taking: Reported on 07/09/2021)    [DISCONTINUED] Multiple Vitamins-Minerals (MULTIVITAMIN MEN 50+) TABS Take by mouth. (Patient not taking: Reported on 07/09/2021)   [DISCONTINUED] sodium chloride flush (NS) 0.9 % injection 16 mL    No facility-administered encounter medications on file as of 07/22/2023.    Past Medical History:  Diagnosis Date   Allergy    seasonal   GERD (gastroesophageal reflux disease)    Dietary and TLC related -- in 20s. No issues since stopping tobacco use    Past Surgical History:  Procedure Laterality Date   BACK SURGERY     2007   WISDOM TOOTH EXTRACTION Bilateral     Family History  Problem Relation Age of Onset   Stroke Mother    Diabetes Mother    Colonic polyp Mother    Asthma Mother    Heart murmur Mother    Arrhythmia Mother    Heart disease Mother    Heart attack Father    Colonic polyp Father    Arthritis Father    Hypertension Father    Hyperlipidemia Father    Hearing loss Father    Colon polyps Father    Colon polyps Sister    Bell's palsy Brother    Diabetes Maternal Grandmother    Heart attack Maternal Grandfather    Cancer Paternal Grandmother    Early death Paternal Grandfather    Colon cancer Other    Esophageal cancer Neg Hx    Rectal cancer Neg Hx    Stomach cancer Neg Hx  Social History   Socioeconomic History   Marital status: Legally Separated    Spouse name: Not on file   Number of children: 3   Years of education: Not on file   Highest education level: Bachelor's degree (e.g., BA, AB, BS)  Occupational History   Not on file  Tobacco Use   Smoking status: Some Days    Types: E-cigarettes    Start date: 01/10/2021   Smokeless tobacco: Never  Vaping Use   Vaping status: Some Days  Substance and Sexual Activity   Alcohol use: Yes    Comment: daily 1-2 cocktails   Drug use: Not Currently   Sexual activity: Yes    Partners: Female  Other Topics Concern   Not on file  Social History Narrative   Not on file   Social Determinants of  Health   Financial Resource Strain: Low Risk  (07/21/2023)   Overall Financial Resource Strain (CARDIA)    Difficulty of Paying Living Expenses: Not hard at all  Food Insecurity: No Food Insecurity (07/21/2023)   Hunger Vital Sign    Worried About Running Out of Food in the Last Year: Never true    Ran Out of Food in the Last Year: Never true  Transportation Needs: No Transportation Needs (07/21/2023)   PRAPARE - Administrator, Civil Service (Medical): No    Lack of Transportation (Non-Medical): No  Physical Activity: Sufficiently Active (07/21/2023)   Exercise Vital Sign    Days of Exercise per Week: 4 days    Minutes of Exercise per Session: 60 min  Stress: Stress Concern Present (07/21/2023)   [employer] of Occupational Health - Occupational Stress Questionnaire    Feeling of Stress : Rather much  Social Connections: Moderately Isolated (07/21/2023)   Social Connection and Isolation Panel [NHANES]    Frequency of Communication with Friends and Family: Twice a week    Frequency of Social Gatherings with Friends and Family: More than three times a week    Attends Religious Services: 1 to 4 times per year    Active Member of [employer] or Organizations: No    Attends Banker Meetings: Never    Marital Status: Separated  Intimate Partner Violence: Not At Risk (07/21/2023)   Humiliation, Afraid, Rape, and Kick questionnaire    Fear of Current or Ex-Partner: No    Emotionally Abused: No    Physically Abused: No    Sexually Abused: No    ROS See HPI above    Objective   BP 122/64   Pulse 67   Temp (!) 97.5 F (36.4 C)   Ht 5' 8.5" (1.74 m)   Wt 149 lb 6 oz (67.8 kg)   SpO2 97%   BMI 22.38 kg/m   Physical Exam Vitals reviewed.  Constitutional:      General: He is not in acute distress.    Appearance: Normal appearance. He is not ill-appearing, toxic-appearing or diaphoretic.  HENT:     Head: Normocephalic and atraumatic.  Eyes:     General:         Right eye: No discharge.        Left eye: No discharge.     Conjunctiva/sclera: Conjunctivae normal.  Cardiovascular:     Rate and Rhythm: Normal rate and regular rhythm.     Heart sounds: Normal heart sounds. No murmur heard.    No friction rub. No gallop.  Pulmonary:     Effort: Pulmonary effort is normal. No  respiratory distress.     Breath sounds: Normal breath sounds.  Musculoskeletal:        General: Normal range of motion.  Skin:    General: Skin is warm and dry.  Neurological:     General: No focal deficit present.     Mental Status: He is alert and oriented to person, place, and time. Mental status is at baseline.  Psychiatric:        Mood and Affect: Mood normal.        Behavior: Behavior normal.        Thought Content: Thought content normal.        Judgment: Judgment normal.      Assessment & Plan:  Anxiety with depression  Encounter for lipid screening for cardiovascular disease -     Lipid panel  Family history of diabetes mellitus -     Hemoglobin A1c  Screening for endocrine disorder -     TSH -     Hemoglobin A1c  Low platelet count (HCC) -     CBC with Differential/Platelet  Encounter for screening for metabolic disorder -     Comprehensive metabolic panel  Prostate cancer screening -     PSA  Encounter to establish care  1.Review health maintenance: -Covid vaccine/booster: Declines vaccines  -Influenza vaccine: Declines for today -HIV screening: Declines screening 2.Ordered screening labs. Patient has had low platelet count in the past, but reports this has occurred for years and has to do with running the blood a certain way.  3.Patients PHQ-9 score is elevated, but patient does not want any medication. He is currently in counseling once a week.  Return in about 1 year (around 07/21/2024) for physical.   Zandra Abts, NP

## 2023-07-23 DIAGNOSIS — F4310 Post-traumatic stress disorder, unspecified: Secondary | ICD-10-CM | POA: Diagnosis not present

## 2023-07-24 ENCOUNTER — Encounter: Payer: Self-pay | Admitting: Family Medicine

## 2023-07-31 DIAGNOSIS — F4310 Post-traumatic stress disorder, unspecified: Secondary | ICD-10-CM | POA: Diagnosis not present

## 2023-08-19 DIAGNOSIS — F4310 Post-traumatic stress disorder, unspecified: Secondary | ICD-10-CM | POA: Diagnosis not present

## 2023-09-02 DIAGNOSIS — F4310 Post-traumatic stress disorder, unspecified: Secondary | ICD-10-CM | POA: Diagnosis not present

## 2023-09-09 DIAGNOSIS — F4310 Post-traumatic stress disorder, unspecified: Secondary | ICD-10-CM | POA: Diagnosis not present

## 2023-10-07 DIAGNOSIS — F4310 Post-traumatic stress disorder, unspecified: Secondary | ICD-10-CM | POA: Diagnosis not present

## 2023-11-10 DIAGNOSIS — F4310 Post-traumatic stress disorder, unspecified: Secondary | ICD-10-CM | POA: Diagnosis not present

## 2023-12-01 DIAGNOSIS — F4310 Post-traumatic stress disorder, unspecified: Secondary | ICD-10-CM | POA: Diagnosis not present

## 2023-12-16 DIAGNOSIS — F4310 Post-traumatic stress disorder, unspecified: Secondary | ICD-10-CM | POA: Diagnosis not present

## 2023-12-31 DIAGNOSIS — F4310 Post-traumatic stress disorder, unspecified: Secondary | ICD-10-CM | POA: Diagnosis not present

## 2024-02-26 DIAGNOSIS — M4723 Other spondylosis with radiculopathy, cervicothoracic region: Secondary | ICD-10-CM | POA: Diagnosis not present

## 2024-02-26 DIAGNOSIS — M9902 Segmental and somatic dysfunction of thoracic region: Secondary | ICD-10-CM | POA: Diagnosis not present

## 2024-02-26 DIAGNOSIS — M9901 Segmental and somatic dysfunction of cervical region: Secondary | ICD-10-CM | POA: Diagnosis not present

## 2024-02-26 DIAGNOSIS — M25521 Pain in right elbow: Secondary | ICD-10-CM | POA: Diagnosis not present

## 2024-03-04 DIAGNOSIS — M25521 Pain in right elbow: Secondary | ICD-10-CM | POA: Diagnosis not present

## 2024-03-04 DIAGNOSIS — M9901 Segmental and somatic dysfunction of cervical region: Secondary | ICD-10-CM | POA: Diagnosis not present

## 2024-03-04 DIAGNOSIS — M4723 Other spondylosis with radiculopathy, cervicothoracic region: Secondary | ICD-10-CM | POA: Diagnosis not present

## 2024-03-04 DIAGNOSIS — M9902 Segmental and somatic dysfunction of thoracic region: Secondary | ICD-10-CM | POA: Diagnosis not present

## 2024-03-18 DIAGNOSIS — M9901 Segmental and somatic dysfunction of cervical region: Secondary | ICD-10-CM | POA: Diagnosis not present

## 2024-03-18 DIAGNOSIS — M9902 Segmental and somatic dysfunction of thoracic region: Secondary | ICD-10-CM | POA: Diagnosis not present

## 2024-03-18 DIAGNOSIS — M25521 Pain in right elbow: Secondary | ICD-10-CM | POA: Diagnosis not present

## 2024-03-18 DIAGNOSIS — M4723 Other spondylosis with radiculopathy, cervicothoracic region: Secondary | ICD-10-CM | POA: Diagnosis not present

## 2024-04-01 DIAGNOSIS — M9901 Segmental and somatic dysfunction of cervical region: Secondary | ICD-10-CM | POA: Diagnosis not present

## 2024-04-01 DIAGNOSIS — M9902 Segmental and somatic dysfunction of thoracic region: Secondary | ICD-10-CM | POA: Diagnosis not present

## 2024-04-01 DIAGNOSIS — M25521 Pain in right elbow: Secondary | ICD-10-CM | POA: Diagnosis not present

## 2024-04-01 DIAGNOSIS — M4723 Other spondylosis with radiculopathy, cervicothoracic region: Secondary | ICD-10-CM | POA: Diagnosis not present

## 2024-04-15 DIAGNOSIS — M9902 Segmental and somatic dysfunction of thoracic region: Secondary | ICD-10-CM | POA: Diagnosis not present

## 2024-04-15 DIAGNOSIS — M9901 Segmental and somatic dysfunction of cervical region: Secondary | ICD-10-CM | POA: Diagnosis not present

## 2024-04-15 DIAGNOSIS — M4723 Other spondylosis with radiculopathy, cervicothoracic region: Secondary | ICD-10-CM | POA: Diagnosis not present

## 2024-04-15 DIAGNOSIS — M25521 Pain in right elbow: Secondary | ICD-10-CM | POA: Diagnosis not present

## 2024-04-29 DIAGNOSIS — M9902 Segmental and somatic dysfunction of thoracic region: Secondary | ICD-10-CM | POA: Diagnosis not present

## 2024-04-29 DIAGNOSIS — M9901 Segmental and somatic dysfunction of cervical region: Secondary | ICD-10-CM | POA: Diagnosis not present

## 2024-04-29 DIAGNOSIS — M4723 Other spondylosis with radiculopathy, cervicothoracic region: Secondary | ICD-10-CM | POA: Diagnosis not present

## 2024-04-29 DIAGNOSIS — M25521 Pain in right elbow: Secondary | ICD-10-CM | POA: Diagnosis not present

## 2024-11-09 ENCOUNTER — Ambulatory Visit: Admitting: Family Medicine

## 2024-11-09 ENCOUNTER — Encounter: Payer: Self-pay | Admitting: Family Medicine

## 2024-11-09 VITALS — BP 120/68 | HR 55 | Temp 98.1°F | Ht 68.5 in | Wt 163.0 lb

## 2024-11-09 DIAGNOSIS — Z23 Encounter for immunization: Secondary | ICD-10-CM | POA: Diagnosis not present

## 2024-11-09 DIAGNOSIS — Z0000 Encounter for general adult medical examination without abnormal findings: Secondary | ICD-10-CM

## 2024-11-09 DIAGNOSIS — Z114 Encounter for screening for human immunodeficiency virus [HIV]: Secondary | ICD-10-CM | POA: Diagnosis not present

## 2024-11-09 DIAGNOSIS — Z1211 Encounter for screening for malignant neoplasm of colon: Secondary | ICD-10-CM | POA: Diagnosis not present

## 2024-11-09 DIAGNOSIS — R5383 Other fatigue: Secondary | ICD-10-CM

## 2024-11-09 DIAGNOSIS — H5789 Other specified disorders of eye and adnexa: Secondary | ICD-10-CM

## 2024-11-09 NOTE — Patient Instructions (Addendum)
-  It was great to see you today. -Physical exam completed today. -Influenza vaccine provided. -Placed a referral to GI for colonoscopy. Please call the office or send a MyChart message if you do not receive a phone call or a MyChart message about appointment in 2 weeks.  -Ordered labs based on physical exam and reason for fatigue.  -Recommend to cleanse right eye with baby shampoo twice a day and pat dry. Recommend warm compresses 4-6 times a day, up to 20 minutes at a time. If not improved in about 1 week, follow up. -Follow up in 1 year for a physical.

## 2024-11-09 NOTE — Progress Notes (Signed)
 Complete physical exam  Patient: Scott Gregory   DOB: 1976-02-01   48 y.o. Male  MRN: [id number]  Subjective:    Chief Complaint  Patient presents with   Annual Exam    Niki Cosman is a 48 y.o. male who presents today for a complete physical exam. He reports consuming a general with mostly vegetables and meet diet. Exercising: Walk 1 mile about 5 times a week. Weight training-2-3 times a week. He generally feels well. He reports sleeping varies between sleeping fairly and poor. He does not have additional problems to discuss today.    Most recent fall risk assessment:    11/09/2024    2:17 PM  Fall Risk   Falls in the past year? 0  Number falls in past yr: 0  Injury with Fall? 0  Risk for fall due to : No Fall Risks  Follow up Falls evaluation completed     Most recent depression screenings:    11/09/2024    2:17 PM 07/22/2023    9:01 AM  PHQ 2/9 Scores  PHQ - 2 Score 1 3  PHQ- 9 Score 4 13      Data saved with a previous flowsheet row definition    Vision:Within last year and Dental: No current dental problems and Receives regular dental care  Past Medical History:  Diagnosis Date   Allergy    seasonal   GERD (gastroesophageal reflux disease)    Dietary and TLC related -- in 20s. No issues since stopping tobacco use   Past Surgical History:  Procedure Laterality Date   BACK SURGERY     2007   WISDOM TOOTH EXTRACTION Bilateral    Social History[1] Social History   Socioeconomic History   Marital status: Legally Separated    Spouse name: Not on file   Number of children: 3   Years of education: Not on file   Highest education level: Bachelor's degree (e.g., BA, AB, BS)  Occupational History   Not on file  Tobacco Use   Smoking status: Former   Smokeless tobacco: Never  Vaping Use   Vaping status: Former  Substance and Sexual Activity   Alcohol use: Yes    Comment: daily 1-2 cocktails   Drug use: Not Currently   Sexual activity: Yes    Partners:  Female  Other Topics Concern   Not on file  Social History Narrative   Not on file   Social Drivers of Health   Tobacco Use: Medium Risk (11/09/2024)   Patient History    Smoking Tobacco Use: Former    Smokeless Tobacco Use: Never    Passive Exposure: Not on Actuary Strain: Low Risk (11/05/2024)   Overall Financial Resource Strain (CARDIA)    Difficulty of Paying Living Expenses: Not hard at all  Food Insecurity: No Food Insecurity (11/05/2024)   Epic    Worried About Radiation Protection Practitioner of Food in the Last Year: Never true    Ran Out of Food in the Last Year: Never true  Transportation Needs: No Transportation Needs (11/05/2024)   Epic    Lack of Transportation (Medical): No    Lack of Transportation (Non-Medical): No  Physical Activity: Sufficiently Active (11/05/2024)   Exercise Vital Sign    Days of Exercise per Week: 5 days    Minutes of Exercise per Session: 30 min  Stress: No Stress Concern Present (11/05/2024)   [employer] of Occupational Health - Occupational Stress Questionnaire    Feeling of  Stress: Only a little  Social Connections: Moderately Isolated (11/05/2024)   Social Connection and Isolation Panel    Frequency of Communication with Friends and Family: Once a week    Frequency of Social Gatherings with Friends and Family: Never    Attends Religious Services: More than 4 times per year    Active Member of Clubs or Organizations: Yes    Attends Banker Meetings: More than 4 times per year    Marital Status: Separated  Intimate Partner Violence: Not At Risk (07/21/2023)   Humiliation, Afraid, Rape, and Kick questionnaire    Fear of Current or Ex-Partner: No    Emotionally Abused: No    Physically Abused: No    Sexually Abused: No  Depression (PHQ2-9): Low Risk (11/09/2024)   Depression (PHQ2-9)    PHQ-2 Score: 4  Alcohol Screen: Low Risk (07/21/2023)   Alcohol Screen    Last Alcohol Screening Score (AUDIT): 4  Housing: Low  Risk (07/21/2023)   Housing    Last Housing Risk Score: 0  Utilities: Not At Risk (07/21/2023)   AHC Utilities    Threatened with loss of utilities: No  Health Literacy: Adequate Health Literacy (07/22/2023)   B1300 Health Literacy    Frequency of need for help with medical instructions: Never   Family Status  Relation Name Status   Mother  Alive   Father  Deceased   Sister  Alive   Brother  Alive   Son  Alive   Son  Alive   Son  Alive   MGM  Deceased   MGF  Deceased   PGM  Deceased   PGF  Deceased   Other maternal great aunt- age Alive   Neg Hx  (Not Specified)  No partnership data on file   Family History  Problem Relation Age of Onset   Stroke Mother    Diabetes Mother    Colonic polyp Mother    Asthma Mother    Heart murmur Mother    Arrhythmia Mother    Heart disease Mother    Heart attack Father    Colonic polyp Father    Arthritis Father    Hypertension Father    Hyperlipidemia Father    Hearing loss Father    Colon polyps Father    Colon polyps Sister    Bell's palsy Brother    Diabetes Maternal Grandmother    Heart attack Maternal Grandfather    Cancer Paternal Grandmother    Early death Paternal Grandfather    Colon cancer Other    Esophageal cancer Neg Hx    Rectal cancer Neg Hx    Stomach cancer Neg Hx    Allergies[2]   Patient Care Team: Billy Philippe SAUNDERS, NP as PCP - General (Family Medicine)   Show/hide medication list[3]  Review of Systems  Constitutional:  Positive for diaphoresis (Night sweats) and malaise/fatigue.  HENT: Negative.    Eyes:  Positive for pain (Upper eye lid discomfort.).  Respiratory: Negative.    Cardiovascular:  Positive for palpitations (During stressful moments).  Gastrointestinal: Negative.   Genitourinary: Negative.   Musculoskeletal:  Positive for back pain, joint pain and neck pain.  Skin: Negative.   Neurological:        Numbness in upper extremities. Alternating.   Endo/Heme/Allergies: Negative.    Psychiatric/Behavioral: Negative.     See HPI above     Objective:   BP 120/68   Pulse (!) 55   Temp 98.1 F (36.7 C) (Oral)  Ht 5' 8.5 (1.74 m)   Wt 163 lb (73.9 kg)   SpO2 99%   BMI 24.42 kg/m     Physical Exam Vitals reviewed.  Constitutional:      General: He is not in acute distress.    Appearance: Normal appearance. He is normal weight. He is not ill-appearing, toxic-appearing or diaphoretic.  HENT:     Head: Normocephalic and atraumatic.     Right Ear: Tympanic membrane, ear canal and external ear normal. There is no impacted cerumen.     Left Ear: Tympanic membrane, ear canal and external ear normal. There is no impacted cerumen.     Nose:     Right Sinus: No maxillary sinus tenderness or frontal sinus tenderness.     Left Sinus: No maxillary sinus tenderness or frontal sinus tenderness.     Mouth/Throat:     Mouth: Mucous membranes are moist.     Pharynx: Oropharynx is clear. No oropharyngeal exudate or posterior oropharyngeal erythema.  Eyes:     General:        Right eye: No foreign body or discharge.        Left eye: No foreign body or discharge.     Extraocular Movements:     Right eye: Normal extraocular motion.     Left eye: Normal extraocular motion.     Conjunctiva/sclera: Conjunctivae normal.     Pupils: Pupils are equal, round, and reactive to light.     Comments: Right upper eye lid is mildly inflamed with slight swelling at the outer edge.   Neck:     Thyroid: No thyromegaly.  Cardiovascular:     Rate and Rhythm: Normal rate and regular rhythm.     Heart sounds: Normal heart sounds. No murmur heard.    No friction rub. No gallop.  Pulmonary:     Effort: Pulmonary effort is normal. No respiratory distress.     Breath sounds: Normal breath sounds.  Abdominal:     General: Abdomen is flat. Bowel sounds are normal. There is no distension.     Palpations: Abdomen is soft. There is no mass.     Tenderness: There is no abdominal tenderness.   Musculoskeletal:        General: Normal range of motion.     Cervical back: Normal range of motion.     Right lower leg: No edema.     Left lower leg: No edema.  Lymphadenopathy:     Head:     Right side of head: No submental or submandibular adenopathy.     Left side of head: No submental or submandibular adenopathy.     Cervical: No cervical adenopathy.  Skin:    General: Skin is warm and dry.  Neurological:     General: No focal deficit present.     Mental Status: He is alert and oriented to person, place, and time. Mental status is at baseline.     Motor: No weakness.     Gait: Gait normal.  Psychiatric:        Mood and Affect: Mood normal.        Behavior: Behavior normal.        Thought Content: Thought content normal.        Judgment: Judgment normal.        Assessment & Plan:    Routine Health Maintenance and Physical Exam  Immunization History  Administered Date(s) Administered   Influenza, Seasonal, Injecte, Preservative Fre 11/09/2024   Influenza,inj,Quad PF,6+  Mos 08/28/2015, 08/06/2017, 08/19/2019   Tdap 08/06/2017    Health Maintenance  Topic Date Due   COVID-19 Vaccine (1) Never done   HIV Screening  Never done   Hepatitis B Vaccines 19-59 Average Risk (1 of 3 - 19+ 3-dose series) Never done   Colonoscopy  12/20/2023   DTaP/Tdap/Td (2 - Td or Tdap) 08/07/2027   Influenza Vaccine  Completed   Hepatitis C Screening  Completed   Pneumococcal Vaccine  Aged Out   HPV VACCINES  Aged Out   Meningococcal B Vaccine  Aged Out    Discussed health benefits of physical activity, and encouraged him to engage in regular exercise appropriate for his age and condition.  Annual physical exam -     CBC with Differential/Platelet; Future -     Comprehensive metabolic panel with GFR; Future -     Lipid panel; Future -     TSH; Future  Colon cancer screening -     Ambulatory referral to Gastroenterology  Immunization due -     Flu vaccine trivalent PF, 6mos  and older(Flulaval,Afluria,Fluarix,Fluzone)  Encounter for screening for HIV -     HIV Antibody (routine testing w rflx); Future  Fatigue, unspecified type -     CBC with Differential/Platelet; Future -     Comprehensive metabolic panel with GFR; Future -     TSH; Future -     Testosterone ; Future -     Vitamin B12; Future -     VITAMIN D  25 Hydroxy (Vit-D Deficiency, Fractures); Future  Irritation of eye  1.Review health maintenance: -Colonoscopy: Placed a referral to GI  -Influenza vaccine: Administered  -HIV screening: Order -Covid vaccine: Declines  -Hep B vaccine: Probably had previously  2.Physical exam completed today. 3.Ordered labs based on physical exam and reason for fatigue.  4.Recommend to cleanse right eye with baby shampoo twice a day and pat dry. Recommend warm compresses 4-6 times a day, up to 20 minutes at a time. If not improved in about 1 week, follow up.  Return in about 1 year (around 11/09/2025) for physical.    Kalyn Hofstra, NP     [1]  Social History Tobacco Use   Smoking status: Former   Smokeless tobacco: Never  Vaping Use   Vaping status: Former  Substance Use Topics   Alcohol use: Yes    Comment: daily 1-2 cocktails   Drug use: Not Currently  [2] No Known Allergies [3]  Outpatient Medications Prior to Visit  Medication Sig   Multiple Vitamins-Minerals (ONE-A-DAY MENS, MINERALS, PO) Take by mouth daily.   Omega-3 Fatty Acids (FISH OIL) 500 MG CAPS Take 1 capsule by mouth daily.   VITAMIN D  PO Take 5,000 Int'l Units/oz by mouth daily.   [DISCONTINUED] FLUoxetine  (PROZAC ) 20 MG capsule Take 1 capsule (20 mg total) by mouth daily for 21 days, THEN 2 capsules (40 mg total) daily.   No facility-administered medications prior to visit.

## 2024-11-11 ENCOUNTER — Other Ambulatory Visit (INDEPENDENT_AMBULATORY_CARE_PROVIDER_SITE_OTHER)

## 2024-11-11 DIAGNOSIS — Z114 Encounter for screening for human immunodeficiency virus [HIV]: Secondary | ICD-10-CM

## 2024-11-11 DIAGNOSIS — R5383 Other fatigue: Secondary | ICD-10-CM

## 2024-11-11 DIAGNOSIS — Z0000 Encounter for general adult medical examination without abnormal findings: Secondary | ICD-10-CM | POA: Diagnosis not present

## 2024-11-11 LAB — CBC WITH DIFFERENTIAL/PLATELET
Basophils Absolute: 0 K/uL (ref 0.0–0.1)
Basophils Relative: 0.8 % (ref 0.0–3.0)
Eosinophils Absolute: 0.1 K/uL (ref 0.0–0.7)
Eosinophils Relative: 2.9 % (ref 0.0–5.0)
HCT: 41.3 % (ref 39.0–52.0)
Hemoglobin: 14.4 g/dL (ref 13.0–17.0)
Lymphocytes Relative: 25.6 % (ref 12.0–46.0)
Lymphs Abs: 0.9 K/uL (ref 0.7–4.0)
MCHC: 34.7 g/dL (ref 30.0–36.0)
MCV: 91.1 fl (ref 78.0–100.0)
Monocytes Absolute: 0.4 K/uL (ref 0.1–1.0)
Monocytes Relative: 10.3 % (ref 3.0–12.0)
Neutro Abs: 2.2 K/uL (ref 1.4–7.7)
Neutrophils Relative %: 60.4 % (ref 43.0–77.0)
Platelets: 145 K/uL — ABNORMAL LOW (ref 150.0–400.0)
RBC: 4.54 Mil/uL (ref 4.22–5.81)
RDW: 13 % (ref 11.5–15.5)
WBC: 3.6 K/uL — ABNORMAL LOW (ref 4.0–10.5)

## 2024-11-11 LAB — COMPREHENSIVE METABOLIC PANEL WITH GFR
ALT: 12 U/L (ref 3–53)
AST: 14 U/L (ref 5–37)
Albumin: 4.6 g/dL (ref 3.5–5.2)
Alkaline Phosphatase: 37 U/L — ABNORMAL LOW (ref 39–117)
BUN: 19 mg/dL (ref 6–23)
CO2: 31 meq/L (ref 19–32)
Calcium: 9.5 mg/dL (ref 8.4–10.5)
Chloride: 102 meq/L (ref 96–112)
Creatinine, Ser: 0.94 mg/dL (ref 0.40–1.50)
GFR: 95.17 mL/min (ref >60.00)
Glucose, Bld: 77 mg/dL (ref 70–99)
Potassium: 5.2 meq/L — ABNORMAL HIGH (ref 3.5–5.1)
Sodium: 139 meq/L (ref 135–145)
Total Bilirubin: 0.4 mg/dL (ref 0.2–1.2)
Total Protein: 6.6 g/dL (ref 6.0–8.3)

## 2024-11-11 LAB — LIPID PANEL
Cholesterol: 217 mg/dL — ABNORMAL HIGH (ref 28–200)
HDL: 58.7 mg/dL (ref >39.00)
LDL Cholesterol: 127 mg/dL — ABNORMAL HIGH (ref 10–99)
NonHDL: 157.89
Total CHOL/HDL Ratio: 4
Triglycerides: 156 mg/dL — ABNORMAL HIGH (ref 10.0–149.0)
VLDL: 31.2 mg/dL (ref 0.0–40.0)

## 2024-11-11 LAB — VITAMIN B12: Vitamin B-12: 332 pg/mL (ref 211–911)

## 2024-11-11 LAB — TESTOSTERONE: Testosterone: 336.93 ng/dL (ref 300.00–890.00)

## 2024-11-11 LAB — TSH: TSH: 1.49 u[IU]/mL (ref 0.35–5.50)

## 2024-11-11 LAB — VITAMIN D 25 HYDROXY (VIT D DEFICIENCY, FRACTURES): VITD: 53.26 ng/mL (ref 30.00–100.00)

## 2024-11-12 ENCOUNTER — Ambulatory Visit: Payer: Self-pay | Admitting: Family Medicine

## 2024-11-13 LAB — HIV ANTIBODY (ROUTINE TESTING W REFLEX)
HIV 1&2 Ab, 4th Generation: NONREACTIVE
HIV FINAL INTERPRETATION: NEGATIVE

## 2024-12-29 ENCOUNTER — Encounter: Payer: Self-pay | Admitting: Gastroenterology
